# Patient Record
Sex: Female | Born: 1995 | Race: White | Hispanic: No | Marital: Single | Smoking: Former smoker
Health system: Southern US, Community
[De-identification: ages and names within clinical notes are randomized; demographics above are authoritative.]

## PROBLEM LIST (undated history)

## (undated) DIAGNOSIS — F419 Anxiety disorder, unspecified: Secondary | ICD-10-CM

## (undated) DIAGNOSIS — A549 Gonococcal infection, unspecified: Secondary | ICD-10-CM

## (undated) DIAGNOSIS — H9191 Unspecified hearing loss, right ear: Secondary | ICD-10-CM

## (undated) DIAGNOSIS — T7412XA Child physical abuse, confirmed, initial encounter: Secondary | ICD-10-CM

## (undated) DIAGNOSIS — A749 Chlamydial infection, unspecified: Secondary | ICD-10-CM

## (undated) HISTORY — DX: Unspecified hearing loss, right ear: H91.91

## (undated) HISTORY — DX: Anxiety disorder, unspecified: F41.9

## (undated) HISTORY — DX: Child physical abuse, confirmed, initial encounter: T74.12XA

## (undated) HISTORY — PX: TONSILLECTOMY: SUR1361

## (undated) HISTORY — PX: DILATION AND CURETTAGE OF UTERUS: SHX78

## (undated) HISTORY — DX: Chlamydial infection, unspecified: A74.9

## (undated) HISTORY — PX: LAPAROSCOPIC APPENDECTOMY: SUR753

## (undated) HISTORY — PX: APPENDECTOMY: SHX54

## (undated) HISTORY — DX: Gonococcal infection, unspecified: A54.9

## (undated) HISTORY — PX: INNER EAR SURGERY: SHX679

---
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
## 2014-01-12 ENCOUNTER — Ambulatory Visit (INDEPENDENT_AMBULATORY_CARE_PROVIDER_SITE_OTHER): Payer: BC Managed Care – PPO | Admitting: Obstetrics & Gynecology

## 2014-01-12 ENCOUNTER — Encounter: Payer: Self-pay | Admitting: Obstetrics & Gynecology

## 2014-01-12 VITALS — BP 135/82 | HR 120 | Ht 59.0 in | Wt 108.0 lb

## 2014-01-12 DIAGNOSIS — Z3401 Encounter for supervision of normal first pregnancy, first trimester: Secondary | ICD-10-CM

## 2014-01-12 DIAGNOSIS — Z23 Encounter for immunization: Secondary | ICD-10-CM | POA: Diagnosis not present

## 2014-01-12 DIAGNOSIS — Z3491 Encounter for supervision of normal pregnancy, unspecified, first trimester: Secondary | ICD-10-CM

## 2014-01-12 MED ORDER — INFLUENZA VAC SPLIT QUAD 0.5 ML IM SUSY
0.5000 mL | PREFILLED_SYRINGE | Freq: Once | INTRAMUSCULAR | Status: AC
  Administered 2014-01-12: 0.5 mL via INTRAMUSCULAR

## 2014-01-12 NOTE — Progress Notes (Signed)
Pt here for NOB intake.  This is a non-planned pregnancy.  Pt is G3 P0 A2  Bedside U/S shows IUP with FHT of 182 BPM and CRL 20.387mm

## 2014-01-13 ENCOUNTER — Telehealth: Payer: Self-pay | Admitting: *Deleted

## 2014-01-13 ENCOUNTER — Other Ambulatory Visit: Payer: Self-pay | Admitting: Obstetrics & Gynecology

## 2014-01-13 DIAGNOSIS — Z3400 Encounter for supervision of normal first pregnancy, unspecified trimester: Secondary | ICD-10-CM | POA: Insufficient documentation

## 2014-01-13 DIAGNOSIS — N760 Acute vaginitis: Secondary | ICD-10-CM

## 2014-01-13 DIAGNOSIS — B9689 Other specified bacterial agents as the cause of diseases classified elsewhere: Secondary | ICD-10-CM

## 2014-01-13 DIAGNOSIS — B373 Candidiasis of vulva and vagina: Secondary | ICD-10-CM

## 2014-01-13 DIAGNOSIS — Z3682 Encounter for antenatal screening for nuchal translucency: Secondary | ICD-10-CM

## 2014-01-13 DIAGNOSIS — B3731 Acute candidiasis of vulva and vagina: Secondary | ICD-10-CM

## 2014-01-13 LAB — OBSTETRIC PANEL
Antibody Screen: NEGATIVE
BASOS PCT: 0 % (ref 0–1)
Basophils Absolute: 0 10*3/uL (ref 0.0–0.1)
EOS ABS: 0 10*3/uL (ref 0.0–0.7)
Eosinophils Relative: 0 % (ref 0–5)
HCT: 38 % (ref 36.0–46.0)
HEP B S AG: NEGATIVE
Hemoglobin: 12.8 g/dL (ref 12.0–15.0)
Lymphocytes Relative: 23 % (ref 12–46)
Lymphs Abs: 2.9 10*3/uL (ref 0.7–4.0)
MCH: 31.1 pg (ref 26.0–34.0)
MCHC: 33.7 g/dL (ref 30.0–36.0)
MCV: 92.2 fL (ref 78.0–100.0)
MPV: 9.5 fL (ref 8.6–12.4)
Monocytes Absolute: 0.9 10*3/uL (ref 0.1–1.0)
Monocytes Relative: 7 % (ref 3–12)
NEUTROS ABS: 8.8 10*3/uL — AB (ref 1.7–7.7)
NEUTROS PCT: 70 % (ref 43–77)
PLATELETS: 285 10*3/uL (ref 150–400)
RBC: 4.12 MIL/uL (ref 3.87–5.11)
RDW: 13.5 % (ref 11.5–15.5)
RH TYPE: POSITIVE
Rubella: 5.79 Index — ABNORMAL HIGH (ref <0.90)
WBC: 12.5 10*3/uL — AB (ref 4.0–10.5)

## 2014-01-13 LAB — GC/CHLAMYDIA PROBE AMP

## 2014-01-13 LAB — WET PREP BY MOLECULAR PROBE
Candida species: POSITIVE — AB
GARDNERELLA VAGINALIS: POSITIVE — AB
Trichomonas vaginosis: NEGATIVE

## 2014-01-13 LAB — HIV ANTIBODY (ROUTINE TESTING W REFLEX): HIV: NONREACTIVE

## 2014-01-13 MED ORDER — METRONIDAZOLE 500 MG PO TABS: 500.0000 mg | ORAL_TABLET | Freq: Two times a day (BID) | ORAL | Status: DC

## 2014-01-13 MED ORDER — FLUCONAZOLE 150 MG PO TABS: 150.0000 mg | ORAL_TABLET | Freq: Once | ORAL | Status: DC

## 2014-01-13 NOTE — Telephone Encounter (Signed)
Called pt to adv needs to come in for urine specimen only. LMOM for pt to rtn call on Monday when office opens to schedule nurse visit for labs.

## 2014-01-13 NOTE — Telephone Encounter (Signed)
LM on voicemail of positive BV and yeast.  [facility] [place] Flagyl and Diflucan per Dr Penne LashLeggett.
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
## 2014-01-14 LAB — CULTURE, URINE COMPREHENSIVE
Colony Count: NO GROWTH
ORGANISM ID, BACTERIA: NO GROWTH

## 2014-01-15 NOTE — L&D Delivery Note (Addendum)
Delivery Note At 10:19 PM a viable female was delivered via Vaginal, Spontaneous Delivery (Presentation: ; Occiput Anterior).  APGAR: 6, 8; weight  pending.   Placenta status: Intact, Spontaneous.  Cord: 3 vessels with the following complications: None.    Anesthesia: Epidural  Episiotomy: None Lacerations: Labial Suture Repair: None Est. Blood Loss (mL): 125  Mom to postpartum.  Baby to Couplet care / Skin to Skin.  Lowanda Foster 08/26/2014, 10:38 PM  I was present for the entire delivery of baby and placenta and inspection of perineum and agree with above. Placenta to: BS Feeding: Breast Circ: IP Contraception: Depo  [place], CNM 08/26/2014 11:08 PM

## 2014-01-20 LAB — GC/CHLAMYDIA PROBE AMP
CT Probe RNA: NEGATIVE
GC PROBE AMP APTIMA: NEGATIVE

## 2014-02-08 ENCOUNTER — Ambulatory Visit (HOSPITAL_COMMUNITY)
Admission: RE | Admit: 2014-02-08 | Discharge: 2014-02-08 | Disposition: A | Discharge status: Home / Self Care | Payer: BLUE CROSS/BLUE SHIELD | Source: Ambulatory Visit | Attending: Obstetrics & Gynecology | Admitting: Obstetrics & Gynecology

## 2014-02-08 ENCOUNTER — Encounter (HOSPITAL_COMMUNITY): Payer: Self-pay

## 2014-02-08 DIAGNOSIS — Z36 Encounter for antenatal screening of mother: Secondary | ICD-10-CM | POA: Diagnosis not present

## 2014-02-08 DIAGNOSIS — O99321 Drug use complicating pregnancy, first trimester: Secondary | ICD-10-CM | POA: Insufficient documentation

## 2014-02-08 DIAGNOSIS — F1290 Cannabis use, unspecified, uncomplicated: Secondary | ICD-10-CM | POA: Insufficient documentation

## 2014-02-08 DIAGNOSIS — Z3A12 12 weeks gestation of pregnancy: Secondary | ICD-10-CM | POA: Diagnosis not present

## 2014-02-08 DIAGNOSIS — Z3682 Encounter for antenatal screening for nuchal translucency: Secondary | ICD-10-CM | POA: Insufficient documentation

## 2014-02-11 ENCOUNTER — Other Ambulatory Visit (HOSPITAL_COMMUNITY): Payer: Self-pay

## 2014-02-11 ENCOUNTER — Other Ambulatory Visit (HOSPITAL_COMMUNITY): Payer: BC Managed Care – PPO

## 2014-02-11 ENCOUNTER — Ambulatory Visit (HOSPITAL_COMMUNITY): Payer: BC Managed Care – PPO

## 2014-02-12 ENCOUNTER — Encounter: Payer: BC Managed Care – PPO | Admitting: Advanced Practice Midwife

## 2014-02-15 ENCOUNTER — Ambulatory Visit (INDEPENDENT_AMBULATORY_CARE_PROVIDER_SITE_OTHER): Payer: BLUE CROSS/BLUE SHIELD | Admitting: Obstetrics & Gynecology

## 2014-02-15 VITALS — BP 122/70 | HR 88 | Wt 110.0 lb

## 2014-02-15 DIAGNOSIS — Z3402 Encounter for supervision of normal first pregnancy, second trimester: Secondary | ICD-10-CM

## 2014-02-15 NOTE — Progress Notes (Signed)
Routine visit. No problems. We discussed weight gain recommendations. Her First screen was normal. U/S at 20 weeks.

## 2014-03-15 ENCOUNTER — Ambulatory Visit (INDEPENDENT_AMBULATORY_CARE_PROVIDER_SITE_OTHER): Payer: BLUE CROSS/BLUE SHIELD | Admitting: Advanced Practice Midwife

## 2014-03-15 VITALS — BP 118/65 | HR 79 | Wt 112.0 lb

## 2014-03-15 DIAGNOSIS — Z3402 Encounter for supervision of normal first pregnancy, second trimester: Secondary | ICD-10-CM

## 2014-03-15 DIAGNOSIS — Z3492 Encounter for supervision of normal pregnancy, unspecified, second trimester: Secondary | ICD-10-CM

## 2014-03-15 DIAGNOSIS — Z36 Encounter for antenatal screening of mother: Secondary | ICD-10-CM

## 2014-03-15 DIAGNOSIS — Z3482 Encounter for supervision of other normal pregnancy, second trimester: Secondary | ICD-10-CM

## 2014-03-15 NOTE — Progress Notes (Signed)
Rt sciatic pain.  Review of exercises

## 2014-03-15 NOTE — Progress Notes (Signed)
Anatomy scan scheduled. AFP today.

## 2014-03-15 NOTE — Patient Instructions (Signed)
Second Trimester of Pregnancy The second trimester is from week 13 through week 28, months 4 through 6. The second trimester is often a time when you feel your best. Your body has also adjusted to being pregnant, and you begin to feel better physically. Usually, morning sickness has lessened or quit completely, you may have more energy, and you may have an increase in appetite. The second trimester is also a time when the fetus is growing rapidly. At the end of the sixth month, the fetus is about 9 inches long and weighs about 1 pounds. You will likely begin to feel the baby move (quickening) between 18 and 20 weeks of the pregnancy. BODY CHANGES Your body goes through many changes during pregnancy. The changes vary from woman to woman.   Your weight will continue to increase. You will notice your lower abdomen bulging out.  You may begin to get stretch marks on your hips, abdomen, and breasts.  You may develop headaches that can be relieved by medicines approved by your health care provider.  You may urinate more often because the fetus is pressing on your bladder.  You may develop or continue to have heartburn as a result of your pregnancy.  You may develop constipation because certain hormones are causing the muscles that push waste through your intestines to slow down.  You may develop hemorrhoids or swollen, bulging veins (varicose veins).  You may have back pain because of the weight gain and pregnancy hormones relaxing your joints between the bones in your pelvis and as a result of a shift in weight and the muscles that support your balance.  Your breasts will continue to grow and be tender.  Your gums may bleed and may be sensitive to brushing and flossing.  Dark spots or blotches (chloasma, mask of pregnancy) may develop on your face. This will likely fade after the baby is born.  A dark line from your belly button to the pubic area (linea nigra) may appear. This will likely fade  after the baby is born.  You may have changes in your hair. These can include thickening of your hair, rapid growth, and changes in texture. Some women also have hair loss during or after pregnancy, or hair that feels dry or thin. Your hair will most likely return to normal after your baby is born. WHAT TO EXPECT AT YOUR PRENATAL VISITS During a routine prenatal visit:  You will be weighed to make sure you and the fetus are growing normally.  Your blood pressure will be taken.  Your abdomen will be measured to track your baby's growth.  The fetal heartbeat will be listened to.  Any test results from the previous visit will be discussed. Your health care provider may ask you:  How you are feeling.  If you are feeling the baby move.  If you have had any abnormal symptoms, such as leaking fluid, bleeding, severe headaches, or abdominal cramping.  If you have any questions. Other tests that may be performed during your second trimester include:  Blood tests that check for:  Low iron levels (anemia).  Gestational diabetes (between 24 and 28 weeks).  Rh antibodies.  Urine tests to check for infections, diabetes, or protein in the urine.  An ultrasound to confirm the proper growth and development of the baby.  An amniocentesis to check for possible genetic problems.  Fetal screens for spina bifida and Down syndrome. HOME CARE INSTRUCTIONS   Avoid all smoking, herbs, alcohol, and unprescribed   drugs. These chemicals affect the formation and growth of the baby.  Follow your health care provider's instructions regarding medicine use. There are medicines that are either safe or unsafe to take during pregnancy.  Exercise only as directed by your health care provider. Experiencing uterine cramps is a good sign to stop exercising.  Continue to eat regular, healthy meals.  Wear a good support bra for breast tenderness.  Do not use hot tubs, steam rooms, or saunas.  Wear your  seat belt at all times when driving.  Avoid raw meat, uncooked cheese, cat litter boxes, and soil used by cats. These carry germs that can cause birth defects in the baby.  Take your prenatal vitamins.  Try taking a stool softener (if your health care provider approves) if you develop constipation. Eat more high-fiber foods, such as fresh vegetables or fruit and whole grains. Drink plenty of fluids to keep your urine clear or pale yellow.  Take warm sitz baths to soothe any pain or discomfort caused by hemorrhoids. Use hemorrhoid cream if your health care provider approves.  If you develop varicose veins, wear support hose. Elevate your feet for 15 minutes, 3-4 times a day. Limit salt in your diet.  Avoid heavy lifting, wear low heel shoes, and practice good posture.  Rest with your legs elevated if you have leg cramps or low back pain.  Visit your dentist if you have not gone yet during your pregnancy. Use a soft toothbrush to brush your teeth and be gentle when you floss.  A sexual relationship may be continued unless your health care provider directs you otherwise.  Continue to go to all your prenatal visits as directed by your health care provider. SEEK MEDICAL CARE IF:   You have dizziness.  You have mild pelvic cramps, pelvic pressure, or nagging pain in the abdominal area.  You have persistent nausea, vomiting, or diarrhea.  You have a bad smelling vaginal discharge.  You have pain with urination. SEEK IMMEDIATE MEDICAL CARE IF:   You have a fever.  You are leaking fluid from your vagina.  You have spotting or bleeding from your vagina.  You have severe abdominal cramping or pain.  You have rapid weight gain or loss.  You have shortness of breath with chest pain.  You notice sudden or extreme swelling of your face, hands, ankles, feet, or legs.  You have not felt your baby move in over an hour.  You have severe headaches that do not go away with  medicine.  You have vision changes. Document Released: 12/26/2000 Document Revised: 01/06/2013 Document Reviewed: 03/04/2012 ExitCare Patient Information 2015 [employer]. This information is not intended to replace advice given to you by your health care provider. Make sure you discuss any questions you have with your health care provider.  

## 2014-03-16 LAB — ALPHA FETOPROTEIN, MATERNAL
AFP: 14 ng/mL
CURR GEST AGE: 17.4 wks.days
MOM FOR AFP: 0.29
Open Spina bifida: NEGATIVE

## 2014-03-26 ENCOUNTER — Telehealth: Payer: Self-pay | Admitting: *Deleted

## 2014-03-26 NOTE — Telephone Encounter (Signed)
Pt called states she noticed vaginal spotting after intercourse ('just 1 spot') but has not noticed anything since then. I explained to pt that spotting after intercourse is normal but will need to contact us if bleeding increases or continues. Pt expressed understanding.

## 2014-03-29 ENCOUNTER — Ambulatory Visit (HOSPITAL_COMMUNITY)
Admission: RE | Admit: 2014-03-29 | Discharge: 2014-03-29 | Disposition: A | Discharge status: Home / Self Care | Payer: BLUE CROSS/BLUE SHIELD | Source: Ambulatory Visit | Attending: Advanced Practice Midwife | Admitting: Advanced Practice Midwife

## 2014-03-29 DIAGNOSIS — Z36 Encounter for antenatal screening of mother: Secondary | ICD-10-CM | POA: Insufficient documentation

## 2014-03-29 DIAGNOSIS — Z3492 Encounter for supervision of normal pregnancy, unspecified, second trimester: Secondary | ICD-10-CM

## 2014-03-29 DIAGNOSIS — Z3A19 19 weeks gestation of pregnancy: Secondary | ICD-10-CM | POA: Diagnosis not present

## 2014-03-29 DIAGNOSIS — Z3689 Encounter for other specified antenatal screening: Secondary | ICD-10-CM | POA: Insufficient documentation

## 2014-04-12 ENCOUNTER — Ambulatory Visit (INDEPENDENT_AMBULATORY_CARE_PROVIDER_SITE_OTHER): Payer: BLUE CROSS/BLUE SHIELD | Admitting: Advanced Practice Midwife

## 2014-04-12 VITALS — BP 105/60 | HR 78 | Wt 121.0 lb

## 2014-04-12 DIAGNOSIS — Z3482 Encounter for supervision of other normal pregnancy, second trimester: Secondary | ICD-10-CM

## 2014-04-12 DIAGNOSIS — Z3492 Encounter for supervision of normal pregnancy, unspecified, second trimester: Secondary | ICD-10-CM

## 2014-04-12 NOTE — Progress Notes (Signed)
Has had a couple of episodes of light pink spotting

## 2014-04-12 NOTE — Progress Notes (Signed)
Doing well.  Good fetal movement, denies vaginal bleeding, LOF, cramping/contractions. Pt reports pink spotting, scant, x 2 episodes after intercourse.   Discussed normal changes to cervix/friability.  Recommend pt call office or go to [facility] if bleeding continues, or occurs without cause.  PTL precautions given/reasons to come to hospital.

## 2014-05-10 ENCOUNTER — Encounter: Payer: BLUE CROSS/BLUE SHIELD | Admitting: Advanced Practice Midwife

## 2014-05-11 ENCOUNTER — Ambulatory Visit (INDEPENDENT_AMBULATORY_CARE_PROVIDER_SITE_OTHER): Payer: BLUE CROSS/BLUE SHIELD | Admitting: Advanced Practice Midwife

## 2014-05-11 VITALS — BP 107/53 | HR 80 | Wt 126.0 lb

## 2014-05-11 DIAGNOSIS — Z3402 Encounter for supervision of normal first pregnancy, second trimester: Secondary | ICD-10-CM

## 2014-05-11 NOTE — Patient Instructions (Signed)

## 2014-05-11 NOTE — Progress Notes (Signed)
Active baby. 28 week labs. Enc CBE.  Discussed R/B circ.

## 2014-05-24 ENCOUNTER — Ambulatory Visit (INDEPENDENT_AMBULATORY_CARE_PROVIDER_SITE_OTHER): Payer: BLUE CROSS/BLUE SHIELD | Admitting: Advanced Practice Midwife

## 2014-05-24 VITALS — BP 119/81 | HR 101 | Wt 129.0 lb

## 2014-05-24 DIAGNOSIS — Z124 Encounter for screening for malignant neoplasm of cervix: Secondary | ICD-10-CM

## 2014-05-24 DIAGNOSIS — Z113 Encounter for screening for infections with a predominantly sexual mode of transmission: Secondary | ICD-10-CM

## 2014-05-24 DIAGNOSIS — B373 Candidiasis of vulva and vagina: Secondary | ICD-10-CM

## 2014-05-24 DIAGNOSIS — B3731 Acute candidiasis of vulva and vagina: Secondary | ICD-10-CM | POA: Insufficient documentation

## 2014-05-24 DIAGNOSIS — Z3402 Encounter for supervision of normal first pregnancy, second trimester: Secondary | ICD-10-CM

## 2014-05-24 DIAGNOSIS — Z3492 Encounter for supervision of normal pregnancy, unspecified, second trimester: Secondary | ICD-10-CM | POA: Diagnosis not present

## 2014-05-24 DIAGNOSIS — Z23 Encounter for immunization: Secondary | ICD-10-CM

## 2014-05-24 MED ORDER — FLUCONAZOLE 150 MG PO TABS: 150.0000 mg | ORAL_TABLET | Freq: Once | ORAL | Status: DC

## 2014-05-24 NOTE — Patient Instructions (Addendum)
Preterm Labor Information Preterm labor is when labor starts at less than 37 weeks of pregnancy. The normal length of a pregnancy is 39 to 41 weeks. CAUSES Often, there is no identifiable underlying cause as to why a woman goes into preterm labor. One of the most common known causes of preterm labor is infection. Infections of the uterus, cervix, vagina, amniotic sac, bladder, kidney, or even the lungs (pneumonia) can cause labor to start. Other suspected causes of preterm labor include:   Urogenital infections, such as yeast infections and bacterial vaginosis.   Uterine abnormalities (uterine shape, uterine septum, fibroids, or bleeding from the placenta).   A cervix that has been operated on (it may fail to stay closed).   Malformations in the fetus.   Multiple gestations (twins, triplets, and so on).   Breakage of the amniotic sac.  RISK FACTORS  Having a previous history of preterm labor.   Having premature rupture of membranes (PROM).   Having a placenta that covers the opening of the cervix (placenta previa).   Having a placenta that separates from the uterus (placental abruption).   Having a cervix that is too weak to hold the fetus in the uterus (incompetent cervix).   Having too much fluid in the amniotic sac (polyhydramnios).   Taking illegal drugs or smoking while pregnant.   Not gaining enough weight while pregnant.   Being younger than 4718 and older than 21 years old.   Having a low socioeconomic status.   Being African American. SYMPTOMS Signs and symptoms of preterm labor include:   Menstrual-like cramps, abdominal pain, or back pain.  Uterine contractions that are regular, as frequent as six in an hour, regardless of their intensity (may be mild or painful).  Contractions that start on the top of the uterus and spread down to the lower abdomen and back.   A sense of increased pelvic pressure.   A watery or bloody mucus discharge that  comes from the vagina.  TREATMENT Depending on the length of the pregnancy and other circumstances, your health care provider may suggest bed rest. If necessary, there are medicines that can be given to stop contractions and to mature the fetal lungs. If labor happens before 34 weeks of pregnancy, a prolonged hospital stay may be recommended. Treatment depends on the condition of both you and the fetus.  WHAT SHOULD YOU DO IF YOU THINK YOU ARE IN PRETERM LABOR? Call your health care provider right away. You will need to go to the hospital to get checked immediately. HOW CAN YOU PREVENT PRETERM LABOR IN FUTURE PREGNANCIES? You should:   Stop smoking if you smoke.  Maintain healthy weight gain and avoid chemicals and drugs that are not necessary.  Be watchful for any type of infection.  Inform your health care provider if you have a known history of preterm labor. Document Released: 03/24/2003 Document Revised: 09/03/2012 Document Reviewed: 02/04/2012 [facility] Patient Information 2015 [employer] [place]. This information is not intended to replace advice given to you by your health care provider. Make sure you discuss any questions you have with your health care provider.   Tdap Vaccine (Tetanus, Diphtheria, Pertussis): What You Need to Know 1. Why get vaccinated? Tetanus, diphtheria and pertussis can be very serious diseases, even for adolescents and adults. Tdap vaccine can protect us from these diseases. TETANUS (Lockjaw) causes painful muscle tightening and stiffness, usually all over the body.  It can lead to tightening of muscles in the head and neck so  you can't open your mouth, swallow, or sometimes even breathe. Tetanus kills about 1 out of 5 people who are infected. DIPHTHERIA can cause a thick coating to form in the back of the throat.  It can lead to breathing problems, paralysis, heart failure, and death. PERTUSSIS (Whooping Cough) causes severe coughing spells, which can cause  difficulty breathing, vomiting and disturbed sleep.  It can also lead to weight loss, incontinence, and rib fractures. Up to 2 in 100 adolescents and 5 in 100 adults with pertussis are hospitalized or have complications, which could include pneumonia or death. These diseases are caused by bacteria. Diphtheria and pertussis are spread from person to person through coughing or sneezing. Tetanus enters the body through cuts, scratches, or wounds. Before vaccines, the [place] saw as many as 200,000 cases a year of diphtheria and pertussis, and hundreds of cases of tetanus. Since vaccination began, tetanus and diphtheria have dropped by about 99% and pertussis by about 80%. 2. Tdap vaccine Tdap vaccine can protect adolescents and adults from tetanus, diphtheria, and pertussis. One dose of Tdap is routinely given at age 21 or 4412. People who did not get Tdap at that age should get it as soon as possible. Tdap is especially important for health care professionals and anyone having close contact with a baby younger than 12 months. Pregnant women should get a dose of Tdap during every pregnancy, to protect the newborn from pertussis. Infants are most at risk for severe, life-threatening complications from pertussis. A similar vaccine, called Td, protects from tetanus and diphtheria, but not pertussis. A Td booster should be given every 10 years. Tdap may be given as one of these boosters if you have not already gotten a dose. Tdap may also be given after a severe cut or burn to prevent tetanus infection. Your doctor can give you more information. Tdap may safely be given at the same time as other vaccines. 3. Some people should not get this vaccine  If you ever had a life-threatening allergic reaction after a dose of any tetanus, diphtheria, or pertussis containing vaccine, OR if you have a severe allergy to any part of this vaccine, you should not get Tdap. Tell your doctor if you have any severe  allergies.  If you had a coma, or long or multiple seizures within 7 days after a childhood dose of DTP or DTaP, you should not get Tdap, unless a cause other than the vaccine was found. You can still get Td.  Talk to your doctor if you:  have epilepsy or another nervous system problem,  had severe pain or swelling after any vaccine containing diphtheria, tetanus or pertussis,  ever had Guillain-Barr Syndrome (GBS),  aren't feeling well on the day the shot is scheduled. 4. Risks of a vaccine reaction With any medicine, including vaccines, there is a chance of side effects. These are usually mild and go away on their own, but serious reactions are also possible. Brief fainting spells can follow a vaccination, leading to injuries from falling. Sitting or lying down for about 15 minutes can help prevent these. Tell your doctor if you feel dizzy or light-headed, or have vision changes or ringing in the ears. Mild problems following Tdap (Did not interfere with activities)  Pain where the shot was given (about 3 in 4 adolescents or 2 in 3 adults)  Redness or swelling where the shot was given (about 1 person in 5)  Mild fever of at least 100.45F (up to about  in 25 adolescents or 1 in 100 adults)  Headache (about 3 or 4 people in 10)  Tiredness (about 1 person in 3 or 4)  Nausea, vomiting, diarrhea, stomach ache (up to 1 in 4 adolescents or 1 in 10 adults)  Chills, body aches, sore joints, rash, swollen glands (uncommon) Moderate problems following Tdap (Interfered with activities, but did not require medical attention)  Pain where the shot was given (about 1 in 5 adolescents or 1 in 100 adults)  Redness or swelling where the shot was given (up to about 1 in 16 adolescents or 1 in 25 adults)  Fever over 102F (about 1 in 100 adolescents or 1 in 250 adults)  Headache (about 3 in 20 adolescents or 1 in 10 adults)  Nausea, vomiting, diarrhea, stomach ache (up to 1 or 3 people in  100)  Swelling of the entire arm where the shot was given (up to about 3 in 100). Severe problems following Tdap (Unable to perform usual activities; required medical attention)  Swelling, severe pain, bleeding and redness in the arm where the shot was given (rare). A severe allergic reaction could occur after any vaccine (estimated less than 1 in a million doses). 5. What if there is a serious reaction? What should I look for?  Look for anything that concerns you, such as signs of a severe allergic reaction, very high fever, or behavior changes. Signs of a severe allergic reaction can include hives, swelling of the face and throat, difficulty breathing, a fast heartbeat, dizziness, and weakness. These would start a few minutes to a few hours after the vaccination. What should I do?  If you think it is a severe allergic reaction or other emergency that can't wait, call 9-1-1 or get the person to the nearest hospital. Otherwise, call your doctor.  Afterward, the reaction should be reported to the "Vaccine Adverse Event Reporting System" (VAERS). Your doctor might file this report, or you can do it yourself through the VAERS web site at www.vaers.hhs.gov, or by calling 1-800-822-7967. VAERS is only for reporting reactions. They do not give medical advice.  6. The National Vaccine Injury Compensation Program The National Vaccine Injury Compensation Program (VICP) is a federal program that was created to compensate people who may have been injured by certain vaccines. Persons who believe they may have been injured by a vaccine can learn about the program and about filing a claim by calling 1-800-338-2382 or visiting the VICP website at www.hrsa.gov/vaccinecompensation. 7. How can I learn more?  Ask your doctor.  Call your local or state health department.  Contact the Centers for Disease Control and Prevention (CDC):  Call 1-800-232-4636 or visit CDC's website at www.cdc.gov/vaccines. CDC  Tdap Vaccine VIS (05/24/11) Document Released: 07/03/2011 Document Revised: 05/18/2013 Document Reviewed: 04/15/2013 ExitCare Patient Information 2015 [employer]. This information is not intended to replace advice given to you by your health care provider. Make sure you discuss any questions you have with your health care provider.  

## 2014-05-24 NOTE — Progress Notes (Signed)
Didn't do 28 week labs last time. Doing them today. C/O increased [place] discharged and itching. Large amount of thick Duer discharge. Vaginal walls and cervic erythematous and friable C/W VC. Prefers Diflucan. Pap, TDaP today.

## 2014-05-25 LAB — CBC WITH DIFFERENTIAL/PLATELET
BASOS ABS: 0 10*3/uL (ref 0.0–0.1)
BASOS PCT: 0 % (ref 0–1)
EOS PCT: 0 % (ref 0–5)
Eosinophils Absolute: 0 10*3/uL (ref 0.0–0.7)
HCT: 34.8 % — ABNORMAL LOW (ref 36.0–46.0)
Hemoglobin: 11.7 g/dL — ABNORMAL LOW (ref 12.0–15.0)
LYMPHS PCT: 21 % (ref 12–46)
Lymphs Abs: 2.1 10*3/uL (ref 0.7–4.0)
MCH: 31.4 pg (ref 26.0–34.0)
MCHC: 33.6 g/dL (ref 30.0–36.0)
MCV: 93.3 fL (ref 78.0–100.0)
MONO ABS: 0.7 10*3/uL (ref 0.1–1.0)
MPV: 10.1 fL (ref 8.6–12.4)
Monocytes Relative: 7 % (ref 3–12)
Neutro Abs: 7.3 10*3/uL (ref 1.7–7.7)
Neutrophils Relative %: 72 % (ref 43–77)
PLATELETS: 181 10*3/uL (ref 150–400)
RBC: 3.73 MIL/uL — ABNORMAL LOW (ref 3.87–5.11)
RDW: 12.9 % (ref 11.5–15.5)
WBC: 10.2 10*3/uL (ref 4.0–10.5)

## 2014-05-25 LAB — RPR

## 2014-05-25 LAB — GLUCOSE TOLERANCE, 1 HOUR (50G) W/O FASTING: GLUCOSE 1 HOUR GTT: 117 mg/dL (ref 70–140)

## 2014-05-25 LAB — HIV ANTIBODY (ROUTINE TESTING W REFLEX): HIV: NONREACTIVE

## 2014-05-25 LAB — CYTOLOGY - PAP

## 2014-05-26 ENCOUNTER — Telehealth: Payer: Self-pay

## 2014-05-26 LAB — CERVICOVAGINAL ANCILLARY ONLY
BACTERIAL VAGINITIS: POSITIVE — AB
CANDIDA VAGINITIS: POSITIVE — AB

## 2014-05-26 NOTE — Telephone Encounter (Signed)
Left message for patient to return call back to give her lab results.  One hour gtt negative. Festus AloeJennifer Ledbetter Howard RN 05-26-14 845  AM

## 2014-06-01 ENCOUNTER — Other Ambulatory Visit: Payer: Self-pay | Admitting: Advanced Practice Midwife

## 2014-06-01 DIAGNOSIS — B3731 Acute candidiasis of vulva and vagina: Secondary | ICD-10-CM

## 2014-06-01 DIAGNOSIS — N760 Acute vaginitis: Principal | ICD-10-CM

## 2014-06-01 DIAGNOSIS — B9689 Other specified bacterial agents as the cause of diseases classified elsewhere: Secondary | ICD-10-CM

## 2014-06-01 DIAGNOSIS — B373 Candidiasis of vulva and vagina: Secondary | ICD-10-CM

## 2014-06-01 MED ORDER — METRONIDAZOLE 500 MG PO TABS: 500.0000 mg | ORAL_TABLET | Freq: Two times a day (BID) | ORAL | Status: DC

## 2014-06-01 NOTE — Progress Notes (Signed)
Pos BV, VVC. Already has Diflucan. Rx Flagyl.

## 2014-06-02 ENCOUNTER — Telehealth: Payer: Self-pay | Admitting: *Deleted

## 2014-06-02 NOTE — Telephone Encounter (Signed)
Pt notified of positive yeast and BV on affirm test.  She already has the RX for Diflucan but a RX for Flagyl called into her pharmacy for the BV per Dorathy KinsmanVirginia Smith, CNM

## 2014-06-07 ENCOUNTER — Ambulatory Visit (INDEPENDENT_AMBULATORY_CARE_PROVIDER_SITE_OTHER): Payer: BLUE CROSS/BLUE SHIELD | Admitting: Advanced Practice Midwife

## 2014-06-07 VITALS — BP 110/70 | HR 76 | Wt 131.0 lb

## 2014-06-07 DIAGNOSIS — Z3483 Encounter for supervision of other normal pregnancy, third trimester: Secondary | ICD-10-CM

## 2014-06-07 DIAGNOSIS — B9689 Other specified bacterial agents as the cause of diseases classified elsewhere: Secondary | ICD-10-CM

## 2014-06-07 DIAGNOSIS — A499 Bacterial infection, unspecified: Secondary | ICD-10-CM

## 2014-06-07 DIAGNOSIS — N760 Acute vaginitis: Secondary | ICD-10-CM

## 2014-06-09 ENCOUNTER — Telehealth: Payer: Self-pay

## 2014-06-09 MED ORDER — ONDANSETRON 4 MG PO TBDP: 4.0000 mg | ORAL_TABLET | Freq: Three times a day (TID) | ORAL | Status: DC | PRN

## 2014-06-09 NOTE — Patient Instructions (Signed)
Third Trimester of Pregnancy The third trimester is from week 29 through week 42, months 7 through 9. The third trimester is a time when the fetus is growing rapidly. At the end of the ninth month, the fetus is about 20 inches in length and weighs 6-10 pounds.  BODY CHANGES Your body goes through many changes during pregnancy. The changes vary from woman to woman.   Your weight will continue to increase. You can expect to gain 25-35 pounds (11-16 kg) by the end of the pregnancy.  You may begin to get stretch marks on your hips, abdomen, and breasts.  You may urinate more often because the fetus is moving lower into your pelvis and pressing on your bladder.  You may develop or continue to have heartburn as a result of your pregnancy.  You may develop constipation because certain hormones are causing the muscles that push waste through your intestines to slow down.  You may develop hemorrhoids or swollen, bulging veins (varicose veins).  You may have pelvic pain because of the weight gain and pregnancy hormones relaxing your joints between the bones in your pelvis. Backaches may result from overexertion of the muscles supporting your posture.  You may have changes in your hair. These can include thickening of your hair, rapid growth, and changes in texture. Some women also have hair loss during or after pregnancy, or hair that feels dry or thin. Your hair will most likely return to normal after your baby is born.  Your breasts will continue to grow and be tender. A yellow discharge may leak from your breasts called colostrum.  Your belly button may stick out.  You may feel short of breath because of your expanding uterus.  You may notice the fetus "dropping," or moving lower in your abdomen.  You may have a bloody mucus discharge. This usually occurs a few days to a week before labor begins.  Your cervix becomes thin and soft (effaced) near your due date. WHAT TO EXPECT AT YOUR PRENATAL  EXAMS  You will have prenatal exams every 2 weeks until week 36. Then, you will have weekly prenatal exams. During a routine prenatal visit:  You will be weighed to make sure you and the fetus are growing normally.  Your blood pressure is taken.  Your abdomen will be measured to track your baby's growth.  The fetal heartbeat will be listened to.  Any test results from the previous visit will be discussed.  You may have a cervical check near your due date to see if you have effaced. At around 36 weeks, your caregiver will check your cervix. At the same time, your caregiver will also perform a test on the secretions of the vaginal tissue. This test is to determine if a type of bacteria, Group B streptococcus, is present. Your caregiver will explain this further. Your caregiver may ask you:  What your birth plan is.  How you are feeling.  If you are feeling the baby move.  If you have had any abnormal symptoms, such as leaking fluid, bleeding, severe headaches, or abdominal cramping.  If you have any questions. Other tests or screenings that may be performed during your third trimester include:  Blood tests that check for low iron levels (anemia).  Fetal testing to check the health, activity level, and growth of the fetus. Testing is done if you have certain medical conditions or if there are problems during the pregnancy. FALSE LABOR You may feel small, irregular contractions that   eventually go away. These are called Braxton Hicks contractions, or false labor. Contractions may last for hours, days, or even weeks before true labor sets in. If contractions come at regular intervals, intensify, or become painful, it is best to be seen by your caregiver.  SIGNS OF LABOR   Menstrual-like cramps.  Contractions that are 5 minutes apart or less.  Contractions that start on the top of the uterus and spread down to the lower abdomen and back.  A sense of increased pelvic pressure or back  pain.  A watery or bloody mucus discharge that comes from the vagina. If you have any of these signs before the 37th week of pregnancy, call your caregiver right away. You need to go to the hospital to get checked immediately. HOME CARE INSTRUCTIONS   Avoid all smoking, herbs, alcohol, and unprescribed drugs. These chemicals affect the formation and growth of the baby.  Follow your caregiver's instructions regarding medicine use. There are medicines that are either safe or unsafe to take during pregnancy.  Exercise only as directed by your caregiver. Experiencing uterine cramps is a good sign to stop exercising.  Continue to eat regular, healthy meals.  Wear a good support bra for breast tenderness.  Do not use hot tubs, steam rooms, or saunas.  Wear your seat belt at all times when driving.  Avoid raw meat, uncooked cheese, cat litter boxes, and soil used by cats. These carry germs that can cause birth defects in the baby.  Take your prenatal vitamins.  Try taking a stool softener (if your caregiver approves) if you develop constipation. Eat more high-fiber foods, such as fresh vegetables or fruit and whole grains. Drink plenty of fluids to keep your urine clear or pale yellow.  Take warm sitz baths to soothe any pain or discomfort caused by hemorrhoids. Use hemorrhoid cream if your caregiver approves.  If you develop varicose veins, wear support hose. Elevate your feet for 15 minutes, 3-4 times a day. Limit salt in your diet.  Avoid heavy lifting, wear low heal shoes, and practice good posture.  Rest a lot with your legs elevated if you have leg cramps or low back pain.  Visit your dentist if you have not gone during your pregnancy. Use a soft toothbrush to brush your teeth and be gentle when you floss.  A sexual relationship may be continued unless your caregiver directs you otherwise.  Do not travel far distances unless it is absolutely necessary and only with the approval  of your caregiver.  Take prenatal classes to understand, practice, and ask questions about the labor and delivery.  Make a trial run to the hospital.  Pack your hospital bag.  Prepare the baby's nursery.  Continue to go to all your prenatal visits as directed by your caregiver. SEEK MEDICAL CARE IF:  You are unsure if you are in labor or if your water has broken.  You have dizziness.  You have mild pelvic cramps, pelvic pressure, or nagging pain in your abdominal area.  You have persistent nausea, vomiting, or diarrhea.  You have a bad smelling vaginal discharge.  You have pain with urination. SEEK IMMEDIATE MEDICAL CARE IF:   You have a fever.  You are leaking fluid from your vagina.  You have spotting or bleeding from your vagina.  You have severe abdominal cramping or pain.  You have rapid weight loss or gain.  You have shortness of breath with chest pain.  You notice sudden or extreme swelling   of your face, hands, ankles, feet, or legs.  You have not felt your baby move in over an hour.  You have severe headaches that do not go away with medicine.  You have vision changes. Document Released: 12/26/2000 Document Revised: 01/06/2013 Document Reviewed: 03/04/2012 [employer] Patient Information 2015 [employer]. This information is not intended to replace advice given to you by your health care provider. Make sure you discuss any questions you have with your health care provider.  

## 2014-06-09 NOTE — Telephone Encounter (Signed)
Patient states for the last two days she has been unable to eat anything. She states she doesn't vomit but does feel nauseous. Patient states she is able to keep fluids down. Patient made aware she should try and eat small frequent meals and avoid greasy fatty foods. Patient states understanding and requesting dissolving tablet medication. Patient pharmacy verified. Patient made aware to come back for evaluation if symptoms not improved. Patient states understanding.  Armandina StammerJennifer Gaye Scorza RN BSN 06-09-14 11:23 AM.

## 2014-06-09 NOTE — Progress Notes (Signed)
Has not taken Flagyl yet, was not sure it was safe. Reassured it is safe to take. Glucola normal

## 2014-06-21 ENCOUNTER — Ambulatory Visit (INDEPENDENT_AMBULATORY_CARE_PROVIDER_SITE_OTHER): Payer: BLUE CROSS/BLUE SHIELD | Admitting: Advanced Practice Midwife

## 2014-06-21 VITALS — BP 108/81 | HR 81 | Wt 133.0 lb

## 2014-06-21 DIAGNOSIS — N760 Acute vaginitis: Secondary | ICD-10-CM

## 2014-06-21 DIAGNOSIS — A499 Bacterial infection, unspecified: Secondary | ICD-10-CM

## 2014-06-21 DIAGNOSIS — B9689 Other specified bacterial agents as the cause of diseases classified elsewhere: Secondary | ICD-10-CM

## 2014-06-21 DIAGNOSIS — Z3402 Encounter for supervision of normal first pregnancy, second trimester: Secondary | ICD-10-CM

## 2014-06-21 MED ORDER — METRONIDAZOLE 0.75 % VA GEL: 1.0000 | Freq: Every day | VAGINAL | Status: DC

## 2014-06-21 NOTE — Progress Notes (Signed)
Can't take Flagyl pills. Discussed recomendation for PO Flagyl, but will try Metrogel.

## 2014-06-21 NOTE — Patient Instructions (Signed)
Braxton Hicks Contractions °Contractions of the uterus can occur throughout pregnancy. Contractions are not always a sign that you are in labor.  °WHAT ARE BRAXTON HICKS CONTRACTIONS?  °Contractions that occur before labor are called Braxton Hicks contractions, or false labor. Toward the end of pregnancy (32-34 weeks), these contractions can develop more often and may become more forceful. This is not true labor because these contractions do not result in opening (dilatation) and thinning of the cervix. They are sometimes difficult to tell apart from true labor because these contractions can be forceful and people have different pain tolerances. You should not feel embarrassed if you go to the hospital with false labor. Sometimes, the only way to tell if you are in true labor is for your health care provider to look for changes in the cervix. °If there are no prenatal problems or other health problems associated with the pregnancy, it is completely safe to be sent home with false labor and await the onset of true labor. °HOW CAN YOU TELL THE DIFFERENCE BETWEEN TRUE AND FALSE LABOR? °False Labor °· The contractions of false labor are usually shorter and not as hard as those of true labor.   °· The contractions are usually irregular.   °· The contractions are often felt in the front of the lower abdomen and in the groin.   °· The contractions may go away when you walk around or change positions while lying down.   °· The contractions get weaker and are shorter lasting as time goes on.   °· The contractions do not usually become progressively stronger, regular, and closer together as with true labor.   °True Labor °1. Contractions in true labor last 30-70 seconds, become very regular, usually become more intense, and increase in frequency.   °2. The contractions do not go away with walking.   °3. The discomfort is usually felt in the top of the uterus and spreads to the lower abdomen and low back.   °4. True labor can  be determined by your health care provider with an exam. This will show that the cervix is dilating and getting thinner.   °WHAT TO REMEMBER °· Keep up with your usual exercises and follow other instructions given by your health care provider.   °· Take medicines as directed by your health care provider.   °· Keep your regular prenatal appointments.   °· Eat and drink lightly if you think you are going into labor.   °· If Braxton Hicks contractions are making you uncomfortable:   °· Change your position from lying down or resting to walking, or from walking to resting.   °· Sit and rest in a tub of warm water.   °· Drink 2-3 glasses of water. Dehydration may cause these contractions.   °· Do slow and deep breathing several times an hour.   °WHEN SHOULD I SEEK IMMEDIATE MEDICAL CARE? °Seek immediate medical care if: °· Your contractions become stronger, more regular, and closer together.   °· You have fluid leaking or gushing from your vagina.   °· You have a fever.   °· You pass blood-tinged mucus.   °· You have vaginal bleeding.   °· You have continuous abdominal pain.   °· You have low back pain that you never had before.   °· You feel your baby's head pushing down and causing pelvic pressure.   °· Your baby is not moving as much as it used to.   °Document Released: 01/01/2005 Document Revised: 01/06/2013 Document Reviewed: 10/13/2012 °[employer]® Patient Information ©2015 [employer]. This information is not intended to replace advice given to you by your health care   provider. Make sure you discuss any questions you have with your health care provider. ° °Fetal Movement Counts °Patient Name: __________________________________________________ Patient Due Date: ____________________ °Performing a fetal movement count is highly recommended in high-risk pregnancies, but it is good for every pregnant woman to do. Your health care provider may ask you to start counting fetal movements at 28 weeks of the pregnancy. Fetal  movements often increase: °· After eating a full meal. °· After physical activity. °· After eating or drinking something sweet or cold. °· At rest. °Pay attention to when you feel the baby is most active. This will help you notice a pattern of your baby's sleep and wake cycles and what factors contribute to an increase in fetal movement. It is important to perform a fetal movement count at the same time each day when your baby is normally most active.  °HOW TO COUNT FETAL MOVEMENTS °5. Find a quiet and comfortable area to sit or lie down on your left side. Lying on your left side provides the best blood and oxygen circulation to your baby. °6. Write down the day and time on a sheet of paper or in a journal. °7. Start counting kicks, flutters, swishes, rolls, or jabs in a 2-hour period. You should feel at least 10 movements within 2 hours. °8. If you do not feel 10 movements in 2 hours, wait 2-3 hours and count again. Look for a change in the pattern or not enough counts in 2 hours. °SEEK MEDICAL CARE IF: °· You feel less than 10 counts in 2 hours, tried twice. °· There is no movement in over an hour. °· The pattern is changing or taking longer each day to reach 10 counts in 2 hours. °· You feel the baby is not moving as he or she usually does. °Date: ____________ Movements: ____________ Start time: ____________ Finish time: ____________  °Date: ____________ Movements: ____________ Start time: ____________ Finish time: ____________ °Date: ____________ Movements: ____________ Start time: ____________ Finish time: ____________ °Date: ____________ Movements: ____________ Start time: ____________ Finish time: ____________ °Date: ____________ Movements: ____________ Start time: ____________ Finish time: ____________ °Date: ____________ Movements: ____________ Start time: ____________ Finish time: ____________ °Date: ____________ Movements: ____________ Start time: ____________ Finish time: ____________ °Date: ____________  Movements: ____________ Start time: ____________ Finish time: ____________  °Date: ____________ Movements: ____________ Start time: ____________ Finish time: ____________ °Date: ____________ Movements: ____________ Start time: ____________ Finish time: ____________ °Date: ____________ Movements: ____________ Start time: ____________ Finish time: ____________ °Date: ____________ Movements: ____________ Start time: ____________ Finish time: ____________ °Date: ____________ Movements: ____________ Start time: ____________ Finish time: ____________ °Date: ____________ Movements: ____________ Start time: ____________ Finish time: ____________ °Date: ____________ Movements: ____________ Start time: ____________ Finish time: ____________  °Date: ____________ Movements: ____________ Start time: ____________ Finish time: ____________ °Date: ____________ Movements: ____________ Start time: ____________ Finish time: ____________ °Date: ____________ Movements: ____________ Start time: ____________ Finish time: ____________ °Date: ____________ Movements: ____________ Start time: ____________ Finish time: ____________ °Date: ____________ Movements: ____________ Start time: ____________ Finish time: ____________ °Date: ____________ Movements: ____________ Start time: ____________ Finish time: ____________ °Date: ____________ Movements: ____________ Start time: ____________ Finish time: ____________  °Date: ____________ Movements: ____________ Start time: ____________ Finish time: ____________ °Date: ____________ Movements: ____________ Start time: ____________ Finish time: ____________ °Date: ____________ Movements: ____________ Start time: ____________ Finish time: ____________ °Date: ____________ Movements: ____________ Start time: ____________ Finish time: ____________ °Date: ____________ Movements: ____________ Start time: ____________ Finish time: ____________ °Date: ____________ Movements: ____________ Start time:  ____________ Finish time: ____________ °Date: ____________ Movements:   ____________ Start time: ____________ Finish time: ____________  °Date: ____________ Movements: ____________ Start time: ____________ Finish time: ____________ °Date: ____________ Movements: ____________ Start time: ____________ Finish time: ____________ °Date: ____________ Movements: ____________ Start time: ____________ Finish time: ____________ °Date: ____________ Movements: ____________ Start time: ____________ Finish time: ____________ °Date: ____________ Movements: ____________ Start time: ____________ Finish time: ____________ °Date: ____________ Movements: ____________ Start time: ____________ Finish time: ____________ °Date: ____________ Movements: ____________ Start time: ____________ Finish time: ____________  °Date: ____________ Movements: ____________ Start time: ____________ Finish time: ____________ °Date: ____________ Movements: ____________ Start time: ____________ Finish time: ____________ °Date: ____________ Movements: ____________ Start time: ____________ Finish time: ____________ °Date: ____________ Movements: ____________ Start time: ____________ Finish time: ____________ °Date: ____________ Movements: ____________ Start time: ____________ Finish time: ____________ °Date: ____________ Movements: ____________ Start time: ____________ Finish time: ____________ °Date: ____________ Movements: ____________ Start time: ____________ Finish time: ____________  °Date: ____________ Movements: ____________ Start time: ____________ Finish time: ____________ °Date: ____________ Movements: ____________ Start time: ____________ Finish time: ____________ °Date: ____________ Movements: ____________ Start time: ____________ Finish time: ____________ °Date: ____________ Movements: ____________ Start time: ____________ Finish time: ____________ °Date: ____________ Movements: ____________ Start time: ____________ Finish time: ____________ °Date:  ____________ Movements: ____________ Start time: ____________ Finish time: ____________ °Date: ____________ Movements: ____________ Start time: ____________ Finish time: ____________  °Date: ____________ Movements: ____________ Start time: ____________ Finish time: ____________ °Date: ____________ Movements: ____________ Start time: ____________ Finish time: ____________ °Date: ____________ Movements: ____________ Start time: ____________ Finish time: ____________ °Date: ____________ Movements: ____________ Start time: ____________ Finish time: ____________ °Date: ____________ Movements: ____________ Start time: ____________ Finish time: ____________ °Date: ____________ Movements: ____________ Start time: ____________ Finish time: ____________ °Document Released: 01/31/2006 Document Revised: 05/18/2013 Document Reviewed: 10/29/2011 °[employer]® Patient Information ©2015 [employer]. This information is not intended to replace advice given to you by your health care provider. Make sure you discuss any questions you have with your health care provider. ° °

## 2014-07-05 ENCOUNTER — Ambulatory Visit (INDEPENDENT_AMBULATORY_CARE_PROVIDER_SITE_OTHER): Payer: BLUE CROSS/BLUE SHIELD | Admitting: Advanced Practice Midwife

## 2014-07-05 VITALS — BP 104/62 | HR 96 | Wt 133.0 lb

## 2014-07-05 DIAGNOSIS — Z3403 Encounter for supervision of normal first pregnancy, third trimester: Secondary | ICD-10-CM

## 2014-07-05 NOTE — Progress Notes (Signed)
Ketones-large 

## 2014-07-05 NOTE — Patient Instructions (Signed)
Braxton Hicks Contractions °Contractions of the uterus can occur throughout pregnancy. Contractions are not always a sign that you are in labor.  °WHAT ARE BRAXTON HICKS CONTRACTIONS?  °Contractions that occur before labor are called Braxton Hicks contractions, or false labor. Toward the end of pregnancy (32-34 weeks), these contractions can develop more often and may become more forceful. This is not true labor because these contractions do not result in opening (dilatation) and thinning of the cervix. They are sometimes difficult to tell apart from true labor because these contractions can be forceful and people have different pain tolerances. You should not feel embarrassed if you go to the hospital with false labor. Sometimes, the only way to tell if you are in true labor is for your health care provider to look for changes in the cervix. °If there are no prenatal problems or other health problems associated with the pregnancy, it is completely safe to be sent home with false labor and await the onset of true labor. °HOW CAN YOU TELL THE DIFFERENCE BETWEEN TRUE AND FALSE LABOR? °False Labor °· The contractions of false labor are usually shorter and not as hard as those of true labor.   °· The contractions are usually irregular.   °· The contractions are often felt in the front of the lower abdomen and in the groin.   °· The contractions may go away when you walk around or change positions while lying down.   °· The contractions get weaker and are shorter lasting as time goes on.   °· The contractions do not usually become progressively stronger, regular, and closer together as with true labor.   °True Labor °1. Contractions in true labor last 30-70 seconds, become very regular, usually become more intense, and increase in frequency.   °2. The contractions do not go away with walking.   °3. The discomfort is usually felt in the top of the uterus and spreads to the lower abdomen and low back.   °4. True labor can  be determined by your health care provider with an exam. This will show that the cervix is dilating and getting thinner.   °WHAT TO REMEMBER °· Keep up with your usual exercises and follow other instructions given by your health care provider.   °· Take medicines as directed by your health care provider.   °· Keep your regular prenatal appointments.   °· Eat and drink lightly if you think you are going into labor.   °· If Braxton Hicks contractions are making you uncomfortable:   °· Change your position from lying down or resting to walking, or from walking to resting.   °· Sit and rest in a tub of warm water.   °· Drink 2-3 glasses of water. Dehydration may cause these contractions.   °· Do slow and deep breathing several times an hour.   °WHEN SHOULD I SEEK IMMEDIATE MEDICAL CARE? °Seek immediate medical care if: °· Your contractions become stronger, more regular, and closer together.   °· You have fluid leaking or gushing from your vagina.   °· You have a fever.   °· You pass blood-tinged mucus.   °· You have vaginal bleeding.   °· You have continuous abdominal pain.   °· You have low back pain that you never had before.   °· You feel your baby's head pushing down and causing pelvic pressure.   °· Your baby is not moving as much as it used to.   °Document Released: 01/01/2005 Document Revised: 01/06/2013 Document Reviewed: 10/13/2012 °[employer]® Patient Information ©2015 [employer]. This information is not intended to replace advice given to you by your health care   provider. Make sure you discuss any questions you have with your health care provider. ° °Fetal Movement Counts °Patient Name: __________________________________________________ Patient Due Date: ____________________ °Performing a fetal movement count is highly recommended in high-risk pregnancies, but it is good for every pregnant woman to do. Your health care provider may ask you to start counting fetal movements at 28 weeks of the pregnancy. Fetal  movements often increase: °· After eating a full meal. °· After physical activity. °· After eating or drinking something sweet or cold. °· At rest. °Pay attention to when you feel the baby is most active. This will help you notice a pattern of your baby's sleep and wake cycles and what factors contribute to an increase in fetal movement. It is important to perform a fetal movement count at the same time each day when your baby is normally most active.  °HOW TO COUNT FETAL MOVEMENTS °5. Find a quiet and comfortable area to sit or lie down on your left side. Lying on your left side provides the best blood and oxygen circulation to your baby. °6. Write down the day and time on a sheet of paper or in a journal. °7. Start counting kicks, flutters, swishes, rolls, or jabs in a 2-hour period. You should feel at least 10 movements within 2 hours. °8. If you do not feel 10 movements in 2 hours, wait 2-3 hours and count again. Look for a change in the pattern or not enough counts in 2 hours. °SEEK MEDICAL CARE IF: °· You feel less than 10 counts in 2 hours, tried twice. °· There is no movement in over an hour. °· The pattern is changing or taking longer each day to reach 10 counts in 2 hours. °· You feel the baby is not moving as he or she usually does. °Date: ____________ Movements: ____________ Start time: ____________ Finish time: ____________  °Date: ____________ Movements: ____________ Start time: ____________ Finish time: ____________ °Date: ____________ Movements: ____________ Start time: ____________ Finish time: ____________ °Date: ____________ Movements: ____________ Start time: ____________ Finish time: ____________ °Date: ____________ Movements: ____________ Start time: ____________ Finish time: ____________ °Date: ____________ Movements: ____________ Start time: ____________ Finish time: ____________ °Date: ____________ Movements: ____________ Start time: ____________ Finish time: ____________ °Date: ____________  Movements: ____________ Start time: ____________ Finish time: ____________  °Date: ____________ Movements: ____________ Start time: ____________ Finish time: ____________ °Date: ____________ Movements: ____________ Start time: ____________ Finish time: ____________ °Date: ____________ Movements: ____________ Start time: ____________ Finish time: ____________ °Date: ____________ Movements: ____________ Start time: ____________ Finish time: ____________ °Date: ____________ Movements: ____________ Start time: ____________ Finish time: ____________ °Date: ____________ Movements: ____________ Start time: ____________ Finish time: ____________ °Date: ____________ Movements: ____________ Start time: ____________ Finish time: ____________  °Date: ____________ Movements: ____________ Start time: ____________ Finish time: ____________ °Date: ____________ Movements: ____________ Start time: ____________ Finish time: ____________ °Date: ____________ Movements: ____________ Start time: ____________ Finish time: ____________ °Date: ____________ Movements: ____________ Start time: ____________ Finish time: ____________ °Date: ____________ Movements: ____________ Start time: ____________ Finish time: ____________ °Date: ____________ Movements: ____________ Start time: ____________ Finish time: ____________ °Date: ____________ Movements: ____________ Start time: ____________ Finish time: ____________  °Date: ____________ Movements: ____________ Start time: ____________ Finish time: ____________ °Date: ____________ Movements: ____________ Start time: ____________ Finish time: ____________ °Date: ____________ Movements: ____________ Start time: ____________ Finish time: ____________ °Date: ____________ Movements: ____________ Start time: ____________ Finish time: ____________ °Date: ____________ Movements: ____________ Start time: ____________ Finish time: ____________ °Date: ____________ Movements: ____________ Start time:  ____________ Finish time: ____________ °Date: ____________ Movements:   ____________ Start time: ____________ Finish time: ____________  °Date: ____________ Movements: ____________ Start time: ____________ Finish time: ____________ °Date: ____________ Movements: ____________ Start time: ____________ Finish time: ____________ °Date: ____________ Movements: ____________ Start time: ____________ Finish time: ____________ °Date: ____________ Movements: ____________ Start time: ____________ Finish time: ____________ °Date: ____________ Movements: ____________ Start time: ____________ Finish time: ____________ °Date: ____________ Movements: ____________ Start time: ____________ Finish time: ____________ °Date: ____________ Movements: ____________ Start time: ____________ Finish time: ____________  °Date: ____________ Movements: ____________ Start time: ____________ Finish time: ____________ °Date: ____________ Movements: ____________ Start time: ____________ Finish time: ____________ °Date: ____________ Movements: ____________ Start time: ____________ Finish time: ____________ °Date: ____________ Movements: ____________ Start time: ____________ Finish time: ____________ °Date: ____________ Movements: ____________ Start time: ____________ Finish time: ____________ °Date: ____________ Movements: ____________ Start time: ____________ Finish time: ____________ °Date: ____________ Movements: ____________ Start time: ____________ Finish time: ____________  °Date: ____________ Movements: ____________ Start time: ____________ Finish time: ____________ °Date: ____________ Movements: ____________ Start time: ____________ Finish time: ____________ °Date: ____________ Movements: ____________ Start time: ____________ Finish time: ____________ °Date: ____________ Movements: ____________ Start time: ____________ Finish time: ____________ °Date: ____________ Movements: ____________ Start time: ____________ Finish time: ____________ °Date:  ____________ Movements: ____________ Start time: ____________ Finish time: ____________ °Date: ____________ Movements: ____________ Start time: ____________ Finish time: ____________  °Date: ____________ Movements: ____________ Start time: ____________ Finish time: ____________ °Date: ____________ Movements: ____________ Start time: ____________ Finish time: ____________ °Date: ____________ Movements: ____________ Start time: ____________ Finish time: ____________ °Date: ____________ Movements: ____________ Start time: ____________ Finish time: ____________ °Date: ____________ Movements: ____________ Start time: ____________ Finish time: ____________ °Date: ____________ Movements: ____________ Start time: ____________ Finish time: ____________ °Document Released: 01/31/2006 Document Revised: 05/18/2013 Document Reviewed: 10/29/2011 °[employer]® Patient Information ©2015 [employer]. This information is not intended to replace advice given to you by your health care provider. Make sure you discuss any questions you have with your health care provider. ° °

## 2014-07-05 NOTE — Progress Notes (Signed)
Few BH UC's. PTL precautions.

## 2014-07-20 ENCOUNTER — Ambulatory Visit (INDEPENDENT_AMBULATORY_CARE_PROVIDER_SITE_OTHER): Payer: BLUE CROSS/BLUE SHIELD | Admitting: Obstetrics & Gynecology

## 2014-07-20 VITALS — BP 117/74 | HR 82 | Wt 137.0 lb

## 2014-07-20 DIAGNOSIS — O210 Mild hyperemesis gravidarum: Secondary | ICD-10-CM

## 2014-07-20 DIAGNOSIS — Z3403 Encounter for supervision of normal first pregnancy, third trimester: Secondary | ICD-10-CM

## 2014-07-20 DIAGNOSIS — Z36 Encounter for antenatal screening of mother: Secondary | ICD-10-CM | POA: Diagnosis not present

## 2014-07-20 DIAGNOSIS — R110 Nausea: Secondary | ICD-10-CM

## 2014-07-20 DIAGNOSIS — O26899 Other specified pregnancy related conditions, unspecified trimester: Secondary | ICD-10-CM

## 2014-07-20 LAB — OB RESULTS CONSOLE GBS: STREP GROUP B AG: NEGATIVE

## 2014-07-20 LAB — OB RESULTS CONSOLE GC/CHLAMYDIA
CHLAMYDIA, DNA PROBE: NEGATIVE
GC PROBE AMP, GENITAL: NEGATIVE

## 2014-07-20 MED ORDER — FAMOTIDINE 20 MG PO TABS: 20.0000 mg | ORAL_TABLET | Freq: Two times a day (BID) | ORAL | Status: DC

## 2014-07-20 MED ORDER — ONDANSETRON 4 MG PO TBDP: 4.0000 mg | ORAL_TABLET | Freq: Three times a day (TID) | ORAL | Status: DC | PRN

## 2014-07-20 NOTE — Progress Notes (Signed)
Reports some nausea, wants Zofran.  This was refilled, also advised to take Pepcid for GERD.   Pelvic cultures done today.  No other complaints or concerns.  Labor and fetal movement precautions reviewed.

## 2014-07-20 NOTE — Patient Instructions (Signed)
Return to clinic for any obstetric concerns or go to [facility] for evaluation  

## 2014-07-21 LAB — GC/CHLAMYDIA PROBE AMP
CT Probe RNA: NEGATIVE
GC Probe RNA: NEGATIVE

## 2014-07-22 LAB — CULTURE, BETA STREP (GROUP B ONLY)

## 2014-07-26 ENCOUNTER — Ambulatory Visit (INDEPENDENT_AMBULATORY_CARE_PROVIDER_SITE_OTHER): Payer: BLUE CROSS/BLUE SHIELD | Admitting: Advanced Practice Midwife

## 2014-07-26 VITALS — BP 107/72 | HR 59 | Wt 135.0 lb

## 2014-07-26 DIAGNOSIS — Z3403 Encounter for supervision of normal first pregnancy, third trimester: Secondary | ICD-10-CM

## 2014-07-26 NOTE — Progress Notes (Signed)
Reviewed neg GBS, cultures. Concerned about losing weight. Slightly high weight gain height but fine overall.

## 2014-07-26 NOTE — Patient Instructions (Signed)
Braxton Hicks Contractions °Contractions of the uterus can occur throughout pregnancy. Contractions are not always a sign that you are in labor.  °WHAT ARE BRAXTON HICKS CONTRACTIONS?  °Contractions that occur before labor are called Braxton Hicks contractions, or false labor. Toward the end of pregnancy (32-34 weeks), these contractions can develop more often and may become more forceful. This is not true labor because these contractions do not result in opening (dilatation) and thinning of the cervix. They are sometimes difficult to tell apart from true labor because these contractions can be forceful and people have different pain tolerances. You should not feel embarrassed if you go to the hospital with false labor. Sometimes, the only way to tell if you are in true labor is for your health care provider to look for changes in the cervix. °If there are no prenatal problems or other health problems associated with the pregnancy, it is completely safe to be sent home with false labor and await the onset of true labor. °HOW CAN YOU TELL THE DIFFERENCE BETWEEN TRUE AND FALSE LABOR? °False Labor °· The contractions of false labor are usually shorter and not as hard as those of true labor.   °· The contractions are usually irregular.   °· The contractions are often felt in the front of the lower abdomen and in the groin.   °· The contractions may go away when you walk around or change positions while lying down.   °· The contractions get weaker and are shorter lasting as time goes on.   °· The contractions do not usually become progressively stronger, regular, and closer together as with true labor.   °True Labor °· Contractions in true labor last 30-70 seconds, become very regular, usually become more intense, and increase in frequency.   °· The contractions do not go away with walking.   °· The discomfort is usually felt in the top of the uterus and spreads to the lower abdomen and low back.   °· True labor can be  determined by your health care provider with an exam. This will show that the cervix is dilating and getting thinner.   °WHAT TO REMEMBER °· Keep up with your usual exercises and follow other instructions given by your health care provider.   °· Take medicines as directed by your health care provider.   °· Keep your regular prenatal appointments.   °· Eat and drink lightly if you think you are going into labor.   °· If Braxton Hicks contractions are making you uncomfortable:   °¨ Change your position from lying down or resting to walking, or from walking to resting.   °¨ Sit and rest in a tub of warm water.   °¨ Drink 2-3 glasses of water. Dehydration may cause these contractions.   °¨ Do slow and deep breathing several times an hour.   °WHEN SHOULD I SEEK IMMEDIATE MEDICAL CARE? °Seek immediate medical care if: °· Your contractions become stronger, more regular, and closer together.   °· You have fluid leaking or gushing from your vagina.   °· You have a fever.   °· You pass blood-tinged mucus.   °· You have vaginal bleeding.   °· You have continuous abdominal pain.   °· You have low back pain that you never had before.   °· You feel your baby's head pushing down and causing pelvic pressure.   °· Your baby is not moving as much as it used to.   °Document Released: 01/01/2005 Document Revised: 01/06/2013 Document Reviewed: 10/13/2012 °[employer]® Patient Information ©2015 [employer]. This information is not intended to replace advice given to you by your health care   provider. Make sure you discuss any questions you have with your health care provider. ° °

## 2014-08-02 ENCOUNTER — Encounter: Payer: Self-pay | Admitting: Advanced Practice Midwife

## 2014-08-02 ENCOUNTER — Ambulatory Visit (INDEPENDENT_AMBULATORY_CARE_PROVIDER_SITE_OTHER): Payer: BLUE CROSS/BLUE SHIELD | Admitting: Advanced Practice Midwife

## 2014-08-02 VITALS — BP 118/79 | HR 78 | Wt 136.0 lb

## 2014-08-02 DIAGNOSIS — Z3403 Encounter for supervision of normal first pregnancy, third trimester: Secondary | ICD-10-CM

## 2014-08-02 NOTE — Patient Instructions (Signed)

## 2014-08-02 NOTE — Progress Notes (Signed)
Subjective:  Donnetta HailJessica Lynne Marzette is a 19 y.o. G3P0020 at 6056w4d being seen today for ongoing prenatal care.  Patient reports occasional contractions.  Contractions: Not present.  Vag. Bleeding: None. Movement: Present. Denies leaking of fluid.   The following portions of the patient's history were reviewed and updated as appropriate: allergies, current medications, past family history, past medical history, past social history, past surgical history and problem list.   Objective:   Filed Vitals:   08/02/14 1108  BP: 118/79  Pulse: 78  Weight: 136 lb (61.689 kg)    Fetal Status: Fetal Heart Rate (bpm): 150   Movement: Present     General:  Alert, oriented and cooperative. Patient is in no acute distress.  Skin: Skin is warm and dry. No rash noted.   Cardiovascular: Normal heart rate noted  Respiratory: Normal respiratory effort, no problems with respiration noted  Abdomen: Soft, gravid, appropriate for gestational age. Pain/Pressure: Present     Vaginal: Vag. Bleeding: None.    Vag D/C Character: Thin  Cervix: Not evaluated        Extremities: Normal range of motion.  Edema: None  Mental Status: Normal mood and affect. Normal behavior. Normal judgment and thought content.   Urinalysis: Urine Protein: Negative Urine Glucose: Negative  GBS Neg  Assessment and Plan:  Pregnancy: G3P0020 at 1456w4d  There are no diagnoses linked to this encounter. Term labor symptoms and general obstetric precautions including but not limited to vaginal bleeding, contractions, leaking of fluid and fetal movement were reviewed in detail with the patient. [facility], IOL after 41 wks, etc Please refer to After Visit Summary for other counseling recommendations.   Return in one week for prenatal visit  Aviva SignsMarie L Justan Gaede, CNM

## 2014-08-09 ENCOUNTER — Ambulatory Visit (INDEPENDENT_AMBULATORY_CARE_PROVIDER_SITE_OTHER): Payer: BLUE CROSS/BLUE SHIELD | Admitting: Advanced Practice Midwife

## 2014-08-09 VITALS — BP 126/76 | HR 79 | Wt 138.0 lb

## 2014-08-09 DIAGNOSIS — Z3403 Encounter for supervision of normal first pregnancy, third trimester: Secondary | ICD-10-CM

## 2014-08-09 NOTE — Patient Instructions (Signed)

## 2014-08-11 NOTE — Progress Notes (Signed)
Subjective:  Isabella Landry is a 19 y.o. G3P0020 at [redacted]w[redacted]d being seen today for ongoing prenatal care.  Patient reports occasional contractions.   Vag. Bleeding: None. Movement: Present. Denies leaking of fluid.   The following portions of the patient's history were reviewed and updated as appropriate: allergies, current medications, past family history, past medical history, past social history, past surgical history and problem list.   Objective:   Filed Vitals:   08/09/14 1013  BP: 126/76  Pulse: 79  Weight: 138 lb (62.596 kg)    Fetal Status: Fetal Heart Rate (bpm): 142   Movement: Present     General:  Alert, oriented and cooperative. Patient is in no acute distress.  Skin: Skin is warm and dry. No rash noted.   Cardiovascular: Normal heart rate noted  Respiratory: Normal respiratory effort, no problems with respiration noted  Abdomen: Soft, gravid, appropriate for gestational age. Pain/Pressure: Present     Vaginal: Vag. Bleeding: None.    Vag D/C Character: Thin  Cervix: Exam revealed      1/long  Extremities: Normal range of motion.  Edema: None  Mental Status: Normal mood and affect. Normal behavior. Normal judgment and thought content.   Urinalysis: Urine Protein: Negative Urine Glucose: Negative  Assessment and Plan:  Pregnancy: G3P0020 at 106w6d  There are no diagnoses linked to this encounter. Term labor symptoms and general obstetric precautions including but not limited to vaginal bleeding, contractions, leaking of fluid and fetal movement were reviewed in detail with the patient. Please refer to After Visit Summary for other counseling recommendations.  Return in about 1 week (around 08/16/2014).   Dorathy Kinsman, CNM

## 2014-08-16 ENCOUNTER — Ambulatory Visit (INDEPENDENT_AMBULATORY_CARE_PROVIDER_SITE_OTHER): Payer: BLUE CROSS/BLUE SHIELD | Admitting: Advanced Practice Midwife

## 2014-08-16 VITALS — BP 104/57 | HR 72 | Wt 139.0 lb

## 2014-08-16 DIAGNOSIS — Z3483 Encounter for supervision of other normal pregnancy, third trimester: Secondary | ICD-10-CM

## 2014-08-16 DIAGNOSIS — Z3403 Encounter for supervision of normal first pregnancy, third trimester: Secondary | ICD-10-CM

## 2014-08-16 NOTE — Progress Notes (Signed)
Subjective:  Isabella Landry is a 19 y.o. G3P0020 at [redacted]w[redacted]d being seen today for ongoing prenatal care.  Patient reports irregular mild cramping.  Contractions: Irregular.  Vag. Bleeding: None. Movement: Present. Denies leaking of fluid.   The following portions of the patient's history were reviewed and updated as appropriate: allergies, current medications, past family history, past medical history, past social history, past surgical history and problem list.   Objective:   Filed Vitals:   08/16/14 0902  BP: 104/57  Pulse: 72  Weight: 139 lb (63.05 kg)    Fetal Status: Fetal Heart Rate (bpm): 130 Fundal Height: 38 cm Movement: Present  Presentation: Vertex  General:  Alert, oriented and cooperative. Patient is in no acute distress.  Skin: Skin is warm and dry. No rash noted.   Cardiovascular: Normal heart rate noted  Respiratory: Normal respiratory effort, no problems with respiration noted  Abdomen: Soft, gravid, appropriate for gestational age. Pain/Pressure: Present     Vaginal: Vag. Bleeding: None.    Vag D/C Character: Thin  Cervix: Exam revealed Dilation: 1 Effacement (%): 50 Station: -3  Extremities: Normal range of motion.  Edema: None  Mental Status: Normal mood and affect. Normal behavior. Normal judgment and thought content.   Urinalysis: Urine Protein: Negative Urine Glucose: Negative  Assessment and Plan:  Pregnancy: G3P0020 at [redacted]w[redacted]d  There are no diagnoses linked to this encounter. Term labor symptoms and general obstetric precautions including but not limited to vaginal bleeding, contractions, leaking of fluid and fetal movement were reviewed in detail with the patient. Please refer to After Visit Summary for other counseling recommendations.  Return in about 1 week (around 08/23/2014).   Hurshel Party, CNM

## 2014-08-16 NOTE — Patient Instructions (Signed)

## 2014-08-23 ENCOUNTER — Ambulatory Visit (INDEPENDENT_AMBULATORY_CARE_PROVIDER_SITE_OTHER): Payer: BLUE CROSS/BLUE SHIELD | Admitting: Obstetrics & Gynecology

## 2014-08-23 ENCOUNTER — Encounter: Payer: Self-pay | Admitting: Obstetrics & Gynecology

## 2014-08-23 VITALS — BP 113/75 | HR 92 | Wt 139.0 lb

## 2014-08-23 DIAGNOSIS — O480 Post-term pregnancy: Secondary | ICD-10-CM | POA: Diagnosis not present

## 2014-08-23 DIAGNOSIS — Z3483 Encounter for supervision of other normal pregnancy, third trimester: Secondary | ICD-10-CM

## 2014-08-23 NOTE — Progress Notes (Signed)
Subjective:  Isabella Landry is a 19 y.o. G3P0020 at [redacted]w[redacted]d being seen today for ongoing prenatal care.  Patient reports no complaints.  Contractions: Irregular.  Vag. Bleeding: None. Movement: Present. Denies leaking of fluid.   The following portions of the patient's history were reviewed and updated as appropriate: allergies, current medications, past family history, past medical history, past social history, past surgical history and problem list.   Objective:   Filed Vitals:   08/23/14 1344  BP: 113/75  Pulse: 92  Weight: 139 lb (63.05 kg)    Fetal Status: Fetal Heart Rate (bpm): NST   Movement: Present     General:  Alert, oriented and cooperative. Patient is in no acute distress.  Skin: Skin is warm and dry. No rash noted.   Cardiovascular: Normal heart rate noted  Respiratory: Normal respiratory effort, no problems with respiration noted  Abdomen: Soft, gravid, appropriate for gestational age. Pain/Pressure: Present     Pelvic: Vag. Bleeding: None Vag D/C Character: Thin   Cervical exam performed Dilation: 1.5 Effacement (%): 80 Station: -2  Extremities: Normal range of motion.  Edema: None  Mental Status: Normal mood and affect. Normal behavior. Normal judgment and thought content.   Urinalysis:      Assessment and Plan:  Pregnancy: G3P0020 at [redacted]w[redacted]d  1. Post-term pregnancy, 40-42 weeks of gestation IOL for 41 weeks - Fetal non-stress test; Standing  Term labor symptoms and general obstetric precautions including but not limited to vaginal bleeding, contractions, leaking of fluid and fetal movement were reviewed in detail with the patient.  Please refer to After Visit Summary for other counseling recommendations.  Return in about 6 weeks (around 10/04/2014) for postpartum visit.   Allie Bossier, MD

## 2014-08-24 ENCOUNTER — Telehealth (HOSPITAL_COMMUNITY): Payer: Self-pay | Admitting: *Deleted

## 2014-08-24 ENCOUNTER — Encounter (HOSPITAL_COMMUNITY): Payer: Self-pay | Admitting: *Deleted

## 2014-08-24 NOTE — Telephone Encounter (Signed)
Preadmission screen  

## 2014-08-26 ENCOUNTER — Inpatient Hospital Stay (HOSPITAL_COMMUNITY): Payer: Medicaid Other | Admitting: Anesthesiology

## 2014-08-26 ENCOUNTER — Inpatient Hospital Stay (HOSPITAL_COMMUNITY)
Admission: RE | Admit: 2014-08-26 | Discharge: 2014-08-28 | DRG: 775 | Disposition: A | Discharge status: Home / Self Care | Payer: Medicaid Other | Source: Ambulatory Visit | Attending: Obstetrics & Gynecology | Admitting: Obstetrics & Gynecology

## 2014-08-26 ENCOUNTER — Encounter (HOSPITAL_COMMUNITY): Payer: Self-pay

## 2014-08-26 DIAGNOSIS — H9191 Unspecified hearing loss, right ear: Secondary | ICD-10-CM | POA: Diagnosis present

## 2014-08-26 DIAGNOSIS — Z87891 Personal history of nicotine dependence: Secondary | ICD-10-CM

## 2014-08-26 DIAGNOSIS — Z3A41 41 weeks gestation of pregnancy: Secondary | ICD-10-CM | POA: Diagnosis present

## 2014-08-26 DIAGNOSIS — O480 Post-term pregnancy: Secondary | ICD-10-CM | POA: Diagnosis present

## 2014-08-26 DIAGNOSIS — K219 Gastro-esophageal reflux disease without esophagitis: Secondary | ICD-10-CM | POA: Diagnosis present

## 2014-08-26 DIAGNOSIS — O9962 Diseases of the digestive system complicating childbirth: Secondary | ICD-10-CM | POA: Diagnosis present

## 2014-08-26 LAB — CBC
HCT: 35 % — ABNORMAL LOW (ref 36.0–46.0)
HEMOGLOBIN: 11.7 g/dL — AB (ref 12.0–15.0)
MCH: 30.5 pg (ref 26.0–34.0)
MCHC: 33.4 g/dL (ref 30.0–36.0)
MCV: 91.4 fL (ref 78.0–100.0)
PLATELETS: 207 10*3/uL (ref 150–400)
RBC: 3.83 MIL/uL — AB (ref 3.87–5.11)
RDW: 13.7 % (ref 11.5–15.5)
WBC: 9.7 10*3/uL (ref 4.0–10.5)

## 2014-08-26 LAB — TYPE AND SCREEN
ABO/RH(D): A POS
ANTIBODY SCREEN: NEGATIVE

## 2014-08-26 LAB — ABO/RH: ABO/RH(D): A POS

## 2014-08-26 MED ORDER — LIDOCAINE HCL (PF) 1 % IJ SOLN
30.0000 mL | INTRAMUSCULAR | Status: DC | PRN
  Filled 2014-08-26: qty 30

## 2014-08-26 MED ORDER — OXYCODONE-ACETAMINOPHEN 5-325 MG PO TABS
1.0000 | ORAL_TABLET | ORAL | Status: DC | PRN
Start: 2014-08-26 — End: 2014-08-27

## 2014-08-26 MED ORDER — CITRIC ACID-SODIUM CITRATE 334-500 MG/5ML PO SOLN
30.0000 mL | ORAL | Status: DC | PRN
  Filled 2014-08-26: qty 15

## 2014-08-26 MED ORDER — ONDANSETRON HCL 4 MG/2ML IJ SOLN: 4.0000 mg | Freq: Four times a day (QID) | INTRAMUSCULAR | Status: DC | PRN

## 2014-08-26 MED ORDER — DIPHENHYDRAMINE HCL 50 MG/ML IJ SOLN: 12.5000 mg | INTRAMUSCULAR | Status: DC | PRN

## 2014-08-26 MED ORDER — ACETAMINOPHEN 325 MG PO TABS: 650.0000 mg | ORAL_TABLET | ORAL | Status: DC | PRN

## 2014-08-26 MED ORDER — IBUPROFEN 100 MG/5ML PO SUSP
600.0000 mg | Freq: Four times a day (QID) | ORAL | Status: DC
  Administered 2014-08-27 – 2014-08-28 (×6): 600 mg via ORAL
  Filled 2014-08-26 (×11): qty 30

## 2014-08-26 MED ORDER — FENTANYL 2.5 MCG/ML BUPIVACAINE 1/10 % EPIDURAL INFUSION (WH - ANES)
14.0000 mL/h | INTRAMUSCULAR | Status: DC | PRN
  Administered 2014-08-26: 11 mL/h via EPIDURAL
  Administered 2014-08-26: 14 mL/h via EPIDURAL
  Filled 2014-08-26: qty 125

## 2014-08-26 MED ORDER — PHENYLEPHRINE 40 MCG/ML (10ML) SYRINGE FOR IV PUSH (FOR BLOOD PRESSURE SUPPORT): 80.0000 ug | PREFILLED_SYRINGE | INTRAVENOUS | Status: DC | PRN

## 2014-08-26 MED ORDER — OXYTOCIN BOLUS FROM INFUSION
500.0000 mL | INTRAVENOUS | Status: DC
  Administered 2014-08-26: 500 mL via INTRAVENOUS

## 2014-08-26 MED ORDER — LIDOCAINE HCL (PF) 1 % IJ SOLN
INTRAMUSCULAR | Status: DC | PRN
  Administered 2014-08-26: 3 mL via EPIDURAL
  Administered 2014-08-26: 4 mL via EPIDURAL

## 2014-08-26 MED ORDER — OXYTOCIN 40 UNITS IN LACTATED RINGERS INFUSION - SIMPLE MED
62.5000 mL/h | INTRAVENOUS | Status: DC
  Filled 2014-08-26: qty 1000

## 2014-08-26 MED ORDER — TERBUTALINE SULFATE 1 MG/ML IJ SOLN
0.2500 mg | Freq: Once | INTRAMUSCULAR | Status: AC
  Administered 2014-08-26: 0.25 mg via SUBCUTANEOUS

## 2014-08-26 MED ORDER — LACTATED RINGERS IV SOLN
500.0000 mL | INTRAVENOUS | Status: DC | PRN
  Administered 2014-08-26: 1000 mL via INTRAVENOUS

## 2014-08-26 MED ORDER — LACTATED RINGERS IV SOLN
INTRAVENOUS | Status: DC
  Administered 2014-08-26: 18:00:00 via INTRAUTERINE

## 2014-08-26 MED ORDER — TERBUTALINE SULFATE 1 MG/ML IJ SOLN
0.2500 mg | Freq: Once | INTRAMUSCULAR | Status: AC | PRN
  Administered 2014-08-26: 0.25 mg via SUBCUTANEOUS
  Filled 2014-08-26: qty 1

## 2014-08-26 MED ORDER — OXYCODONE-ACETAMINOPHEN 5-325 MG PO TABS
2.0000 | ORAL_TABLET | ORAL | Status: DC | PRN
Start: 2014-08-26 — End: 2014-08-27

## 2014-08-26 MED ORDER — LACTATED RINGERS IV SOLN
INTRAVENOUS | Status: DC
  Administered 2014-08-26: 16:00:00 via INTRAVENOUS

## 2014-08-26 MED ORDER — PHENYLEPHRINE 40 MCG/ML (10ML) SYRINGE FOR IV PUSH (FOR BLOOD PRESSURE SUPPORT)
80.0000 ug | PREFILLED_SYRINGE | INTRAVENOUS | Status: DC | PRN
  Administered 2014-08-26: 40 ug via INTRAVENOUS
  Filled 2014-08-26: qty 2
  Filled 2014-08-26: qty 20

## 2014-08-26 MED ORDER — TERBUTALINE SULFATE 1 MG/ML IJ SOLN
INTRAMUSCULAR | Status: AC
  Administered 2014-08-26: 0.25 mg via SUBCUTANEOUS
  Filled 2014-08-26: qty 1

## 2014-08-26 MED ORDER — FLEET ENEMA 7-19 GM/118ML RE ENEM: 1.0000 | ENEMA | RECTAL | Status: DC | PRN

## 2014-08-26 MED ORDER — ZOLPIDEM TARTRATE 5 MG PO TABS: 5.0000 mg | ORAL_TABLET | Freq: Every evening | ORAL | Status: DC | PRN

## 2014-08-26 MED ORDER — EPHEDRINE 5 MG/ML INJ
10.0000 mg | INTRAVENOUS | Status: DC | PRN
  Filled 2014-08-26: qty 2

## 2014-08-26 MED ORDER — MISOPROSTOL 25 MCG QUARTER TABLET
25.0000 ug | ORAL_TABLET | ORAL | Status: DC
  Administered 2014-08-26 (×2): 25 ug via VAGINAL
  Filled 2014-08-26: qty 0.25
  Filled 2014-08-26: qty 1
  Filled 2014-08-26: qty 0.25
  Filled 2014-08-26 (×2): qty 1

## 2014-08-26 MED ORDER — FENTANYL 2.5 MCG/ML BUPIVACAINE 1/10 % EPIDURAL INFUSION (WH - ANES): 11.0000 mL/h | INTRAMUSCULAR | Status: DC | PRN

## 2014-08-26 MED ORDER — FENTANYL CITRATE (PF) 100 MCG/2ML IJ SOLN
100.0000 ug | INTRAMUSCULAR | Status: DC | PRN
  Administered 2014-08-26: 100 ug via INTRAVENOUS
  Filled 2014-08-26: qty 2
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
## 2014-08-26 NOTE — Progress Notes (Signed)
Isabella Landry is a 19 y.o. G3P0020 at [redacted]w[redacted]d admitted for induction of labor due to Post dates.  Subjective: Felt crampy with initial cytotec placement this morning, but no complaints now.  Objective: BP 117/79 mmHg  Pulse 77  Temp(Src) 98.6 F (37 C) (Oral)  Resp 20  Ht  (1.499 m)  Wt 63.05 kg (139 lb)  BMI 28.06 kg/m2  LMP 11/12/2013      FHT:  FHR: 125-135 bpm, variability: moderate,  accelerations:  Present,  decelerations:  Absent UC:   irregular, every 1-4 minutes SVE:   Dilation: 1 Effacement (%): 70 Station: -2, -3 Exam by:: K.Gery Sabedra, CNM- cx very anterior  Labs: Lab Results  Component Value Date   WBC 9.7 08/26/2014   HGB 11.7* 08/26/2014   HCT 35.0* 08/26/2014   MCV 91.4 08/26/2014   PLT 207 08/26/2014    Assessment / Plan: IOL process Post term Cx unfavorable  Second dose of vag cytotec inserted  Plan on a dose or two more of cytotec and then foley  Cayle Cordoba CNM 08/26/2014, 1:58 PM

## 2014-08-26 NOTE — Anesthesia Preprocedure Evaluation (Signed)
Anesthesia Evaluation  Patient identified by MRN, date of birth, ID band Patient awake    Reviewed: Allergy & Precautions, Patient's Chart, lab work & pertinent test results  Airway Mallampati: II  TM Distance: >3 FB Neck ROM: Full    Dental no notable dental hx. (+) Teeth Intact   Pulmonary former smoker,  breath sounds clear to auscultation  Pulmonary exam normal       Cardiovascular negative cardio ROS Normal cardiovascular examRhythm:Regular Rate:Normal     Neuro/Psych Anxiety Deafness Right ear negative neurological ROS     GI/Hepatic negative GI ROS, Neg liver ROS, GERD-  ,  Endo/Other  negative endocrine ROS  Renal/GU negative Renal ROS  negative genitourinary   Musculoskeletal negative musculoskeletal ROS (+)   Abdominal   Peds  Hematology  (+) anemia ,   Anesthesia Other Findings   Reproductive/Obstetrics (+) Pregnancy                             Anesthesia Physical Anesthesia Plan  ASA: II  Anesthesia Plan: Epidural   Post-op Pain Management:    Induction:   Airway Management Planned: Natural Airway  Additional Equipment:   Intra-op Plan:   Post-operative Plan:   Informed Consent: I have reviewed the patients History and Physical, chart, labs and discussed the procedure including the risks, benefits and alternatives for the proposed anesthesia with the patient or authorized representative who has indicated his/her understanding and acceptance.     Plan Discussed with: Anesthesiologist  Anesthesia Plan Comments:         Anesthesia Quick Evaluation

## 2014-08-26 NOTE — Progress Notes (Signed)
Patient ID: Isabella Landry, female   DOB: 1995-03-15, 19 y.o.   MRN: [id number]  CTSP @ 1738 w/ FHR decel to nadir of 70s x a total of 6-7 minutes.  This was brought on shortly after SROM and pt noted to be 6cm. The usual measures like position changes, O2, IVF bolus, IFSE placed were employed in addition to a dose of Terb due to ctx q 1-1.71mins (spont, after cytotec x 2 during the day earlier). Pt also had an epidural placed approx 40 mins earlier, so phenylephrine given to account for possible reaction to low mat BP (105/70). Dr Erin Fulling called to evaluate pt. At 1804 pt noted tod be 8cm when an IUPC was placed and an amnioinfusion started. Then at 1820 Dr H-S examined pt again and found her to be 9cm, at which point a second dose of Terb was given as ctx were again q 1-1.1mins with FHR responding w/ decels. By 4098 the FHR had stablized in the 150s.

## 2014-08-26 NOTE — Anesthesia Procedure Notes (Signed)
Epidural Patient location during procedure: OB Start time: 08/26/2014 4:52 PM  Staffing Anesthesiologist: Mal Amabile Performed by: anesthesiologist   Preanesthetic Checklist Completed: patient identified, site marked, surgical consent, pre-op evaluation, timeout performed, IV checked, risks and benefits discussed and monitors and equipment checked  Epidural Patient position: sitting Prep: site prepped and draped and DuraPrep Patient monitoring: continuous pulse ox and blood pressure Approach: midline Location: L3-L4 Injection technique: LOR air  Needle:  Needle type: Tuohy  Needle gauge: 17 G Needle length: 9 cm and 9 Needle insertion depth: 4 cm Catheter type: closed end flexible Catheter size: 19 Gauge Catheter at skin depth: 9 cm Test dose: negative and Other  Assessment Events: blood not aspirated, injection not painful, no injection resistance, negative IV test and no paresthesia  Additional Notes Patient identified. Risks and benefits discussed including failed block, incomplete  Pain control, post dural puncture headache, nerve damage, paralysis, blood pressure Changes, nausea, vomiting, reactions to medications-both toxic and allergic and post Partum back pain. All questions were answered. Patient expressed understanding and wished to proceed. Sterile technique was used throughout procedure. Epidural site was Dressed with sterile barrier dressing. No paresthesias, signs of intravascular injection Or signs of intrathecal spread were encountered.  Patient was more comfortable after the epidural was dosed. Please see RN's note for documentation of vital signs and FHR which are stable.

## 2014-08-27 ENCOUNTER — Encounter (HOSPITAL_COMMUNITY): Payer: Self-pay

## 2014-08-27 LAB — RPR: RPR: NONREACTIVE

## 2014-08-27 MED ORDER — LANOLIN HYDROUS EX OINT: TOPICAL_OINTMENT | CUTANEOUS | Status: DC | PRN

## 2014-08-27 MED ORDER — OXYCODONE-ACETAMINOPHEN 5-325 MG PO TABS: 1.0000 | ORAL_TABLET | ORAL | Status: DC | PRN

## 2014-08-27 MED ORDER — OXYCODONE HCL 5 MG/5ML PO SOLN
5.0000 mg | ORAL | Status: DC | PRN
  Administered 2014-08-27: 10 mg via ORAL
  Filled 2014-08-27 (×3): qty 10

## 2014-08-27 MED ORDER — BENZOCAINE-MENTHOL 20-0.5 % EX AERO
1.0000 "application " | INHALATION_SPRAY | CUTANEOUS | Status: DC | PRN
  Administered 2014-08-27: 1 via TOPICAL
  Filled 2014-08-27: qty 56

## 2014-08-27 MED ORDER — ACETAMINOPHEN 160 MG/5ML PO SOLN: 325.0000 mg | ORAL | Status: DC | PRN

## 2014-08-27 MED ORDER — DIPHENHYDRAMINE HCL 25 MG PO CAPS: 25.0000 mg | ORAL_CAPSULE | Freq: Four times a day (QID) | ORAL | Status: DC | PRN

## 2014-08-27 MED ORDER — ONDANSETRON HCL 4 MG PO TABS: 4.0000 mg | ORAL_TABLET | ORAL | Status: DC | PRN

## 2014-08-27 MED ORDER — PRENATAL MULTIVITAMIN CH
1.0000 | ORAL_TABLET | Freq: Every day | ORAL | Status: DC
  Administered 2014-08-28: 1 via ORAL
  Filled 2014-08-27 (×2): qty 1

## 2014-08-27 MED ORDER — SENNOSIDES-DOCUSATE SODIUM 8.6-50 MG PO TABS
2.0000 | ORAL_TABLET | ORAL | Status: DC
  Administered 2014-08-28: 2 via ORAL
  Filled 2014-08-27: qty 2

## 2014-08-27 MED ORDER — TETANUS-DIPHTH-ACELL PERTUSSIS 5-2.5-18.5 LF-MCG/0.5 IM SUSP: 0.5000 mL | Freq: Once | INTRAMUSCULAR | Status: DC

## 2014-08-27 MED ORDER — WITCH HAZEL-GLYCERIN EX PADS: 1.0000 "application " | MEDICATED_PAD | CUTANEOUS | Status: DC | PRN

## 2014-08-27 MED ORDER — OXYCODONE-ACETAMINOPHEN 5-325 MG PO TABS: 2.0000 | ORAL_TABLET | ORAL | Status: DC | PRN

## 2014-08-27 MED ORDER — ZOLPIDEM TARTRATE 5 MG PO TABS: 5.0000 mg | ORAL_TABLET | Freq: Every evening | ORAL | Status: DC | PRN

## 2014-08-27 MED ORDER — ONDANSETRON HCL 4 MG/2ML IJ SOLN: 4.0000 mg | INTRAMUSCULAR | Status: DC | PRN

## 2014-08-27 MED ORDER — DIBUCAINE 1 % RE OINT: 1.0000 "application " | TOPICAL_OINTMENT | RECTAL | Status: DC | PRN

## 2014-08-27 MED ORDER — ACETAMINOPHEN 325 MG PO TABS: 650.0000 mg | ORAL_TABLET | ORAL | Status: DC | PRN

## 2014-08-27 MED ORDER — SIMETHICONE 80 MG PO CHEW: 80.0000 mg | CHEWABLE_TABLET | ORAL | Status: DC | PRN

## 2014-08-27 NOTE — Anesthesia Postprocedure Evaluation (Signed)
  Anesthesia Post-op Note  Patient: Isabella Landry  Procedure(s) Performed: * No procedures listed *  Patient Location: PACU and Mother/Baby  Anesthesia Type:Epidural  Level of Consciousness: awake, alert  and oriented  Airway and Oxygen Therapy: Patient Spontanous Breathing  Post-op Pain: none  Post-op Assessment: Post-op Vital signs reviewed, Patient's Cardiovascular Status Stable, No headache, No backache, No residual numbness and No residual motor weakness  Post-op Vital Signs: Reviewed and stable  Complications: No apparent anesthesia complications

## 2014-08-28 MED ORDER — IBUPROFEN 100 MG/5ML PO SUSP: 600.0000 mg | Freq: Four times a day (QID) | ORAL | Status: DC | PRN

## 2014-08-28 NOTE — Discharge Summary (Signed)
Obstetric Discharge Summary Reason for Admission: induction of labor for postdates Prenatal Procedures: none Intrapartum Procedures: spontaneous vaginal delivery Postpartum Procedures: none Complications-Operative and Postpartum: none HEMOGLOBIN  Date Value Ref Range Status  08/26/2014 11.7* 12.0 - 15.0 g/dL Final   HCT  Date Value Ref Range Status  08/26/2014 35.0* 36.0 - 46.0 % Final   Isabella Landry is a 19yo G3P0020 admited @ 41.0wks for IOL due to postdates. She had cx ripening w/ cytotec x 2 doses, second of which put her into labor. She had SROM at approx 5-6cm followed by precipitous dilation resulting in some FHR decels which stabilized allowing for SVD. By PPD#2 she is doing well and is ready for d/c. She is breastfeeding and desires Depo for contraception.  Physical Exam:  General: alert, cooperative and no distress  Heart: RRR Lungs: nl effort Lochia: appropriate Uterine Fundus: firm DVT Evaluation: No evidence of DVT seen on physical exam.  Discharge Diagnoses: Term Pregnancy-delivered  Discharge Information: Date: 08/28/2014 Activity: pelvic rest Diet: routine Medications: PNV and Ibuprofen Condition: stable Instructions: refer to practice specific booklet Discharge to: home Follow-up Information    [facility] up with [facility] [place]. Schedule an appointment as soon as possible for a visit in 4 weeks.   Specialty:  Obstetrics and Gynecology   Why:  For your postpartum appointment.   Contact information:   [address], [address] [place] [address] [phone]      Newborn Data: Live born female  Birth Weight: 7 lb 8.6 oz (3420 g) APGAR: 6, 8  Home with mother.  Cam Hai CNM 08/28/2014, 9:45 AM

## 2014-08-28 NOTE — Discharge Instructions (Signed)

## 2014-08-28 NOTE — Progress Notes (Signed)
Pt d/c home with baby. Education paperwork reviewed prior to departure. Pt, FOB and grandmother verbalize understanding. Pt walked to entrance by NT

## 2014-08-29 NOTE — Progress Notes (Signed)
Patient Details  Name: Isabella Landry MRN: [id number] Date of Birth: 08/26/2016  Date: 08/29/2014  Clinical Social Worker Initiating Note: Isabella Markwood BevelDate/ Time Initiated: 08/28/14/1230   Child's Name: Isabella Landry   Legal Guardian:  (Parents Isabella Landry and Isabella Landry)   Need for Interpreter: None   Date of Referral: 08/27/14   Reason for Referral: Other (Comment)   Referral Source: [facility]   Address: [address] [place], [place] [address]  Phone number:  [phone])   Household Members: Relatives   Natural Supports (not living in the home): Spouse/significant other   Professional Supports:None   Employment: (FOB is employed)   Type of Work:     Education:     Museum/gallery curator Resources:Medicaid, Multimedia programmer   Other Resources: [employer], Physicist, medical    Cultural/Religious Considerations Which May Impact Care: None noted  Strengths: Ability to meet basic needs , Home prepared for child , Compliance with medical plan    Risk Factors/Current Problems:  (Hx of marijuana use)   Cognitive State: Alert    Mood/Affect: Happy , Interested    CSW Assessment: Acknowledged order for Social Work consult to assess mother's hx of anxiety and substance abuse. Met with both parents and maternal grandmother. MOB requested her mother remain present during social work visit. Parents reside together and have no other dependents. FOB is reportedly very supportive. He also participated in the discussion. Mother acknowledged hx of anxiety. Informed that she was hospitalized in a psychiatric hospital once at age 19. She was also prescribed Zoloft for a short period of time. She denies any current symptoms of depression or anxiety. Mother admits to use of marijuana in the past. She states that once she was informed of the pregnancy, she quit immediately. She denies any need for treatment. UDS on  newborn was negative. No acute social concerns related at this time. Mother informed of social work Fish farm manager.   CSW Plan/Description:    Spoke with her about the signs/symptoms of PP Depression, where to get treatment if needed.  Discussed the hospital's drug screen policy Will continue to monitor drug screen. No current barriers to discharge    Paytan Recine J, LCSW 08/29/2014, 12:55 PM

## 2015-12-13 IMAGING — US US MFM FETAL NUCHAL TRANSLUCENCY
1 series · 13 of 28 positions shown · non-contrast
Comparison: none

[Series 1: us mfm fetal nuchal translucency · 0.23mm/px · 13 of 37 slices shown]
[im 2/37]
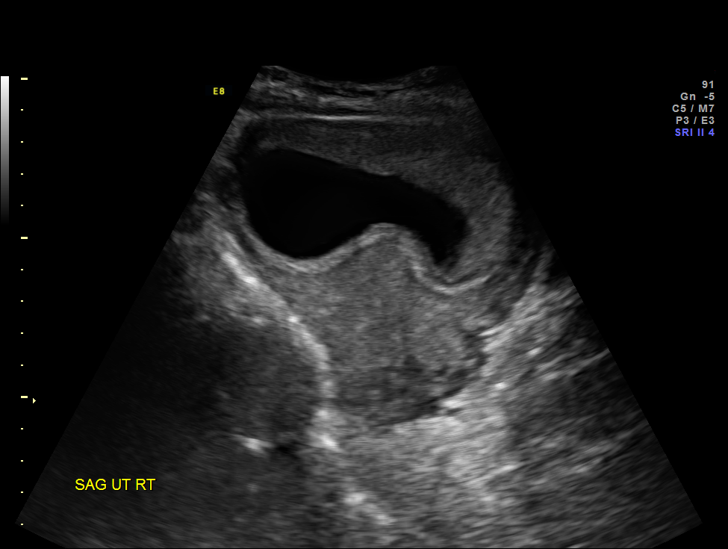
[im 5/37]
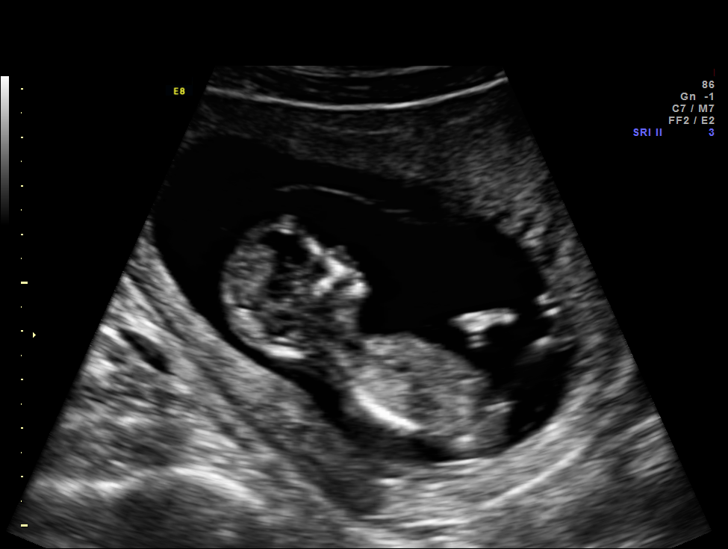
[im 7/37]
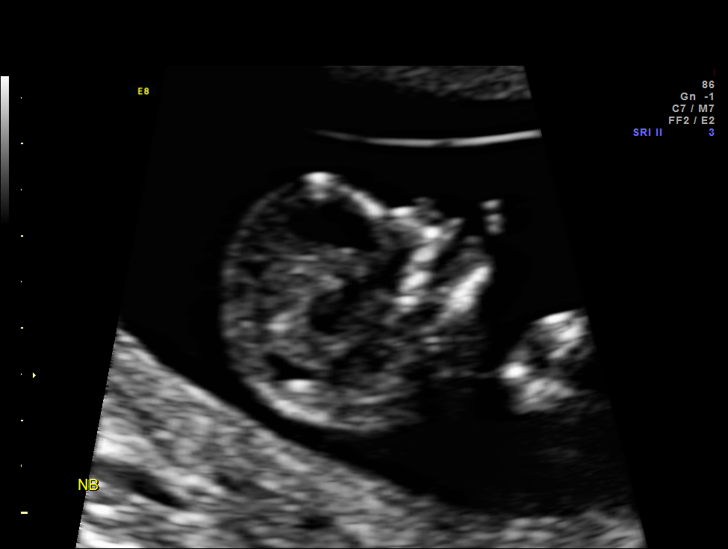
[im 10/37]
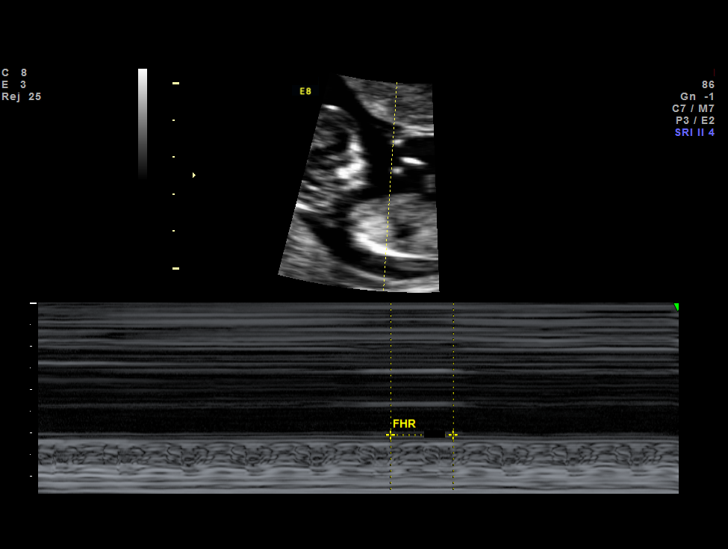
[im 13/37]
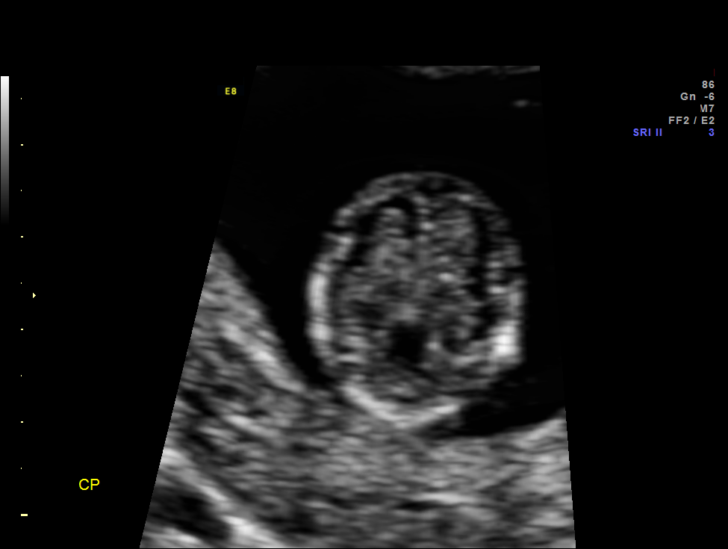
[im 15/37]
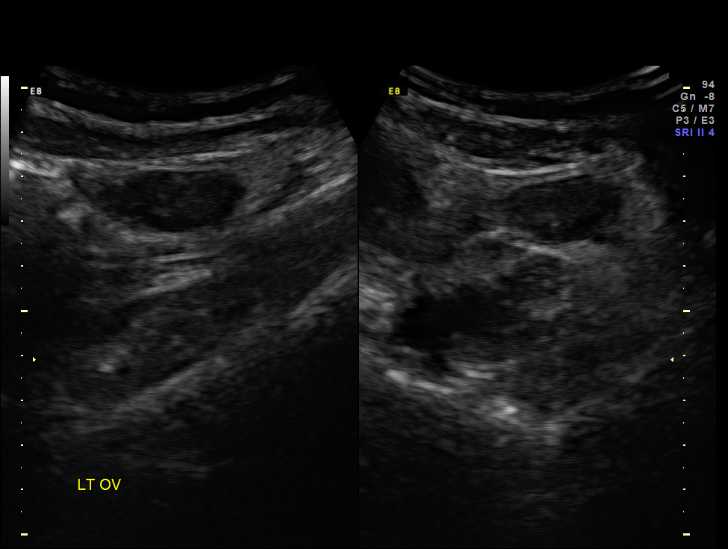
[im 19/37]
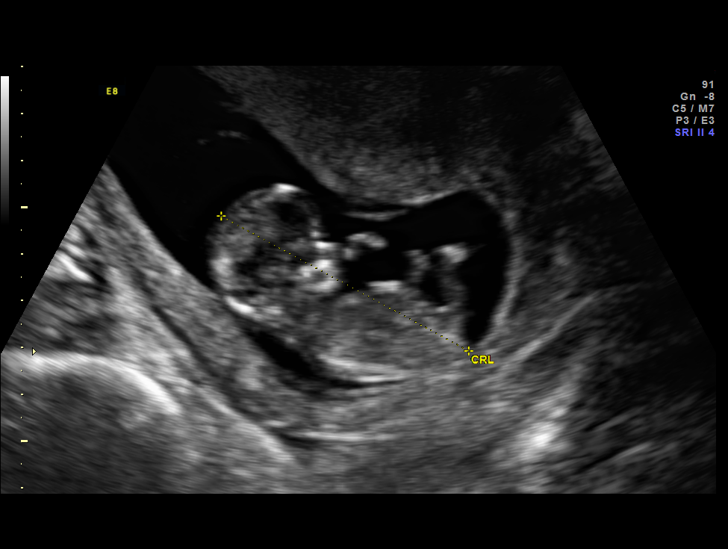
[im 22/37]
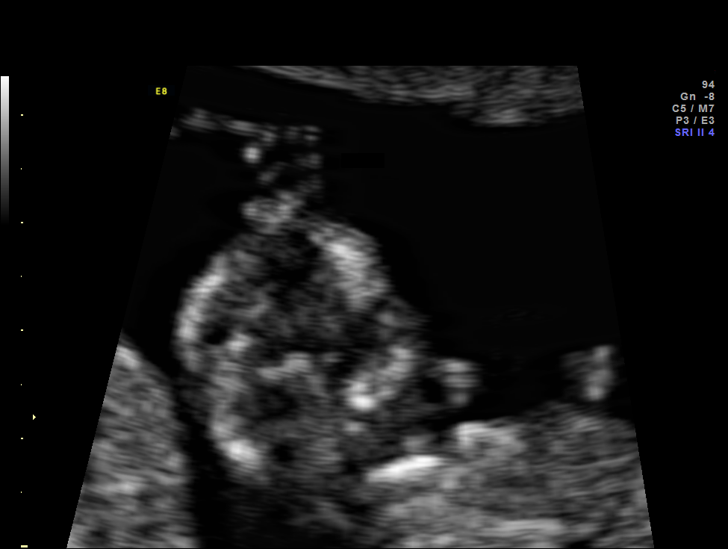
[im 25/37]
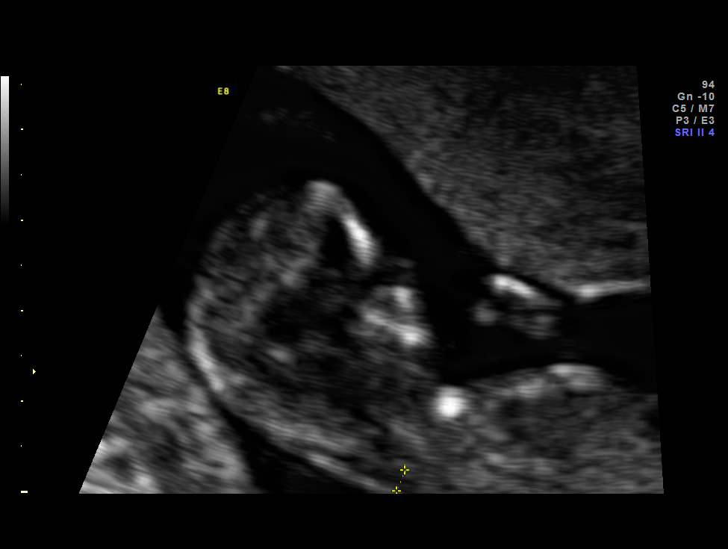
[im 27/37]
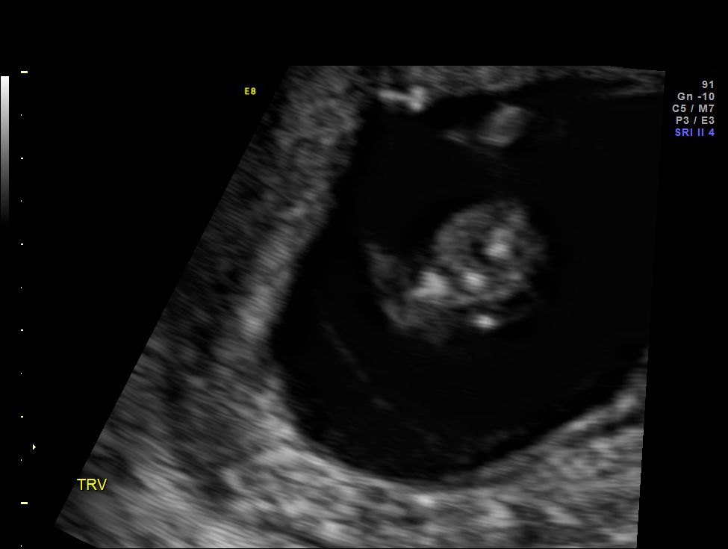
[im 30/37]
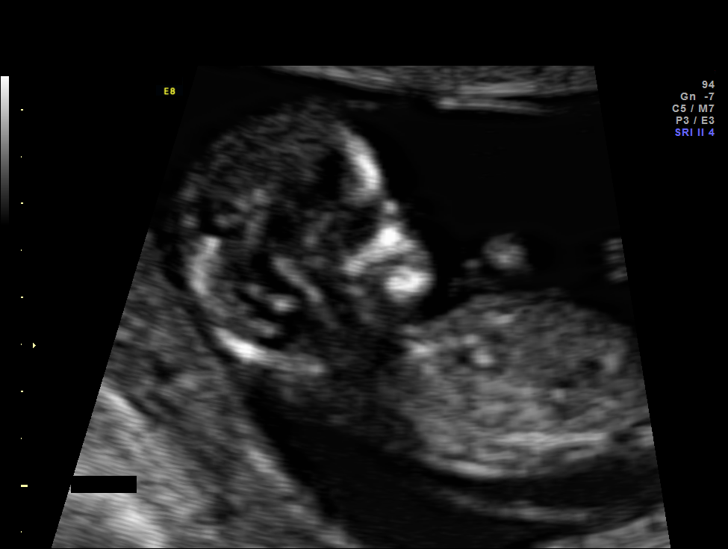
[im 33/37]
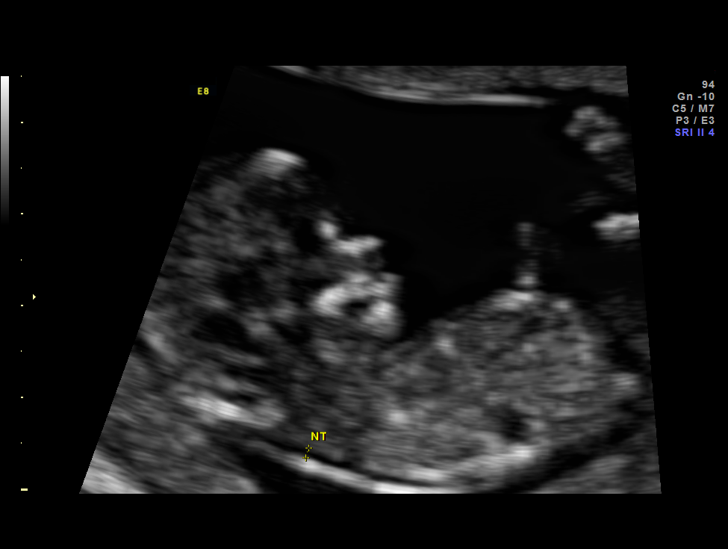
[im 35/37]
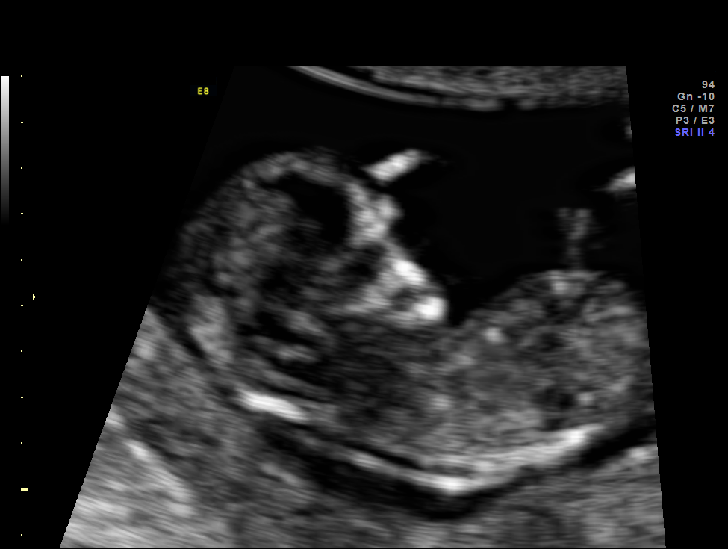

[13 of 28 positions shown; findings below may reference images not displayed]

OBSTETRICS REPORT
                      (Signed Final 02/08/2014 [DATE])

Service(s) Provided

Indications

 Marijuana Abuse
 First trimester aneuploidy screen (NT)                Z36
Fetal Evaluation

 Num Of Fetuses:    1
 Fetal Heart Rate:  161                          bpm
 Cardiac Activity:  Observed
Biometry

 CRL:     60.6  mm     G. Age:  12w 3d                 EDD:    08/20/14
 NT:      1.35  mm
Gestational Age

 LMP:           12w 4d        Date:  11/12/13                 EDD:   08/19/14
 Best:          12w 4d     Det. By:  LMP  (11/12/13)          EDD:   08/19/14
1st Trimester Genetic Sonogram Screening

 CRL:              61  mm    G. Age:   12w 3d                 EDD:   08/20/14
 Nuc Trans:       1.6  mm
 Nasal Bone:                 Present
Cervix Uterus Adnexa

 Cervix:       Closed.
 Uterus:       No abnormality visualized.
 Cul De Sac:   No free fluid seen.

 Left Ovary:    Within normal limits.
 Right Ovary:   Within normal limits.
 Adnexa:     No abnormality visualized.
Impression

 Single IUP at 12w 4d
 Normal NT (1.6 mm).  Nasal bone visualized.
 First trimester aneuploidy screen performed as noted above.
Recommendations

 Please do not draw triple/quad screen, though patient should
 be offered MSAFP for neural tube defect screening.
 Recommend ultrasound for fetal anatomy at 18-20 weeks
 gestation

## 2016-01-31 ENCOUNTER — Encounter: Payer: BLUE CROSS/BLUE SHIELD | Admitting: Obstetrics & Gynecology

## 2016-01-31 DIAGNOSIS — Z3401 Encounter for supervision of normal first pregnancy, first trimester: Secondary | ICD-10-CM

## 2016-01-31 IMAGING — US US OB COMP +14 WK
1 series · 12 of 28 positions shown · non-contrast
Comparison: none

[Series 1: us ob comp +14 wk mfm · 123 acquisitions, 12 frames shown]
[im 5/123]
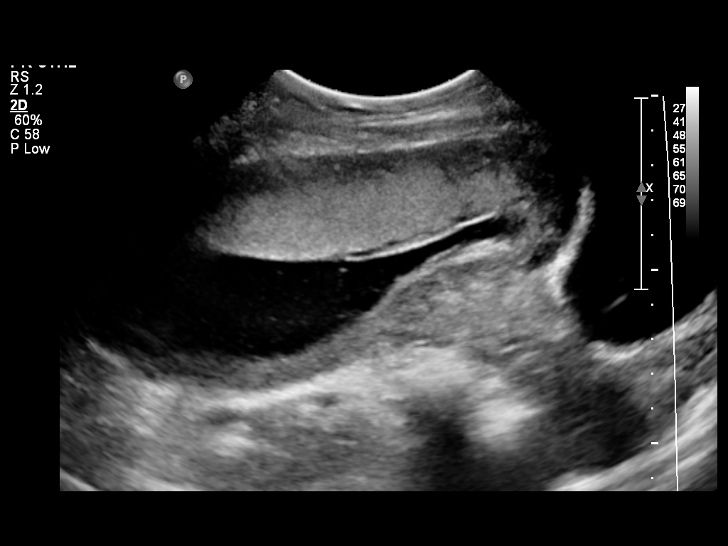
[im 14/123]
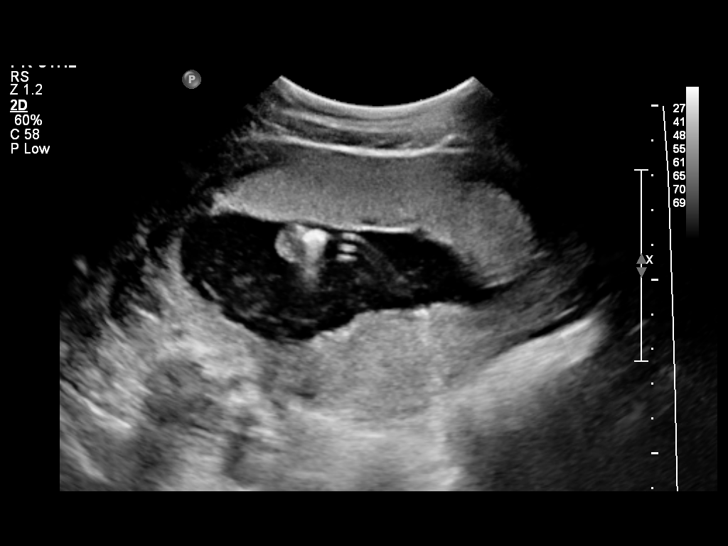
[im 23/123]
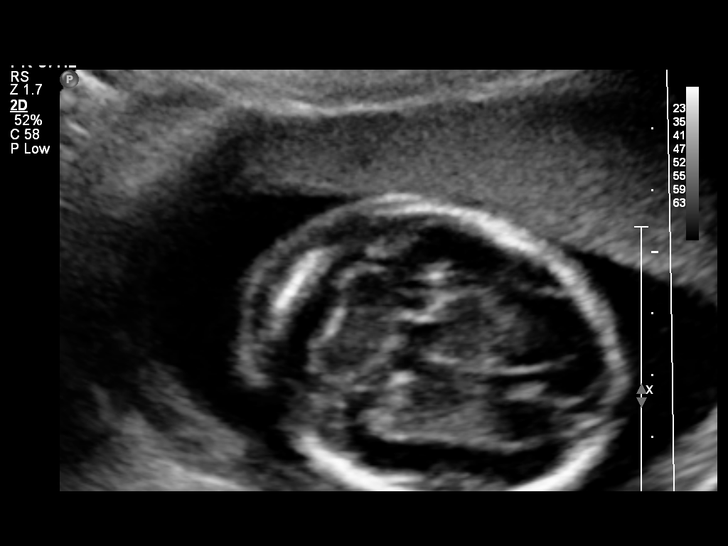
[im 37/123]
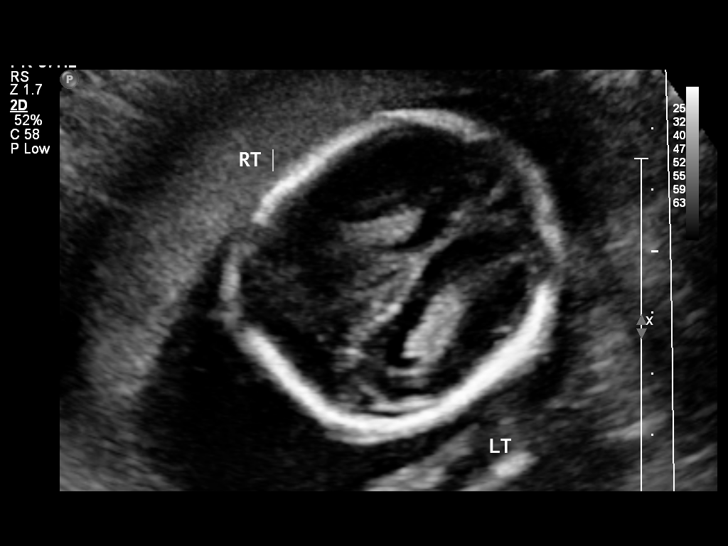
[im 46/123]
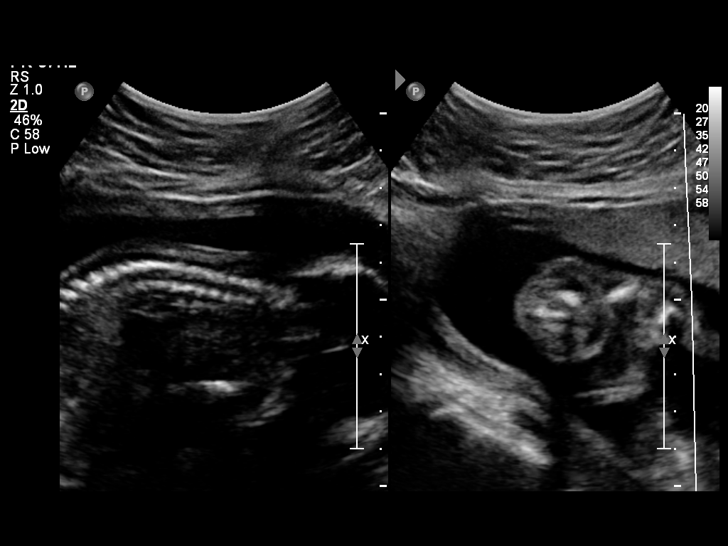
[im 55/123]
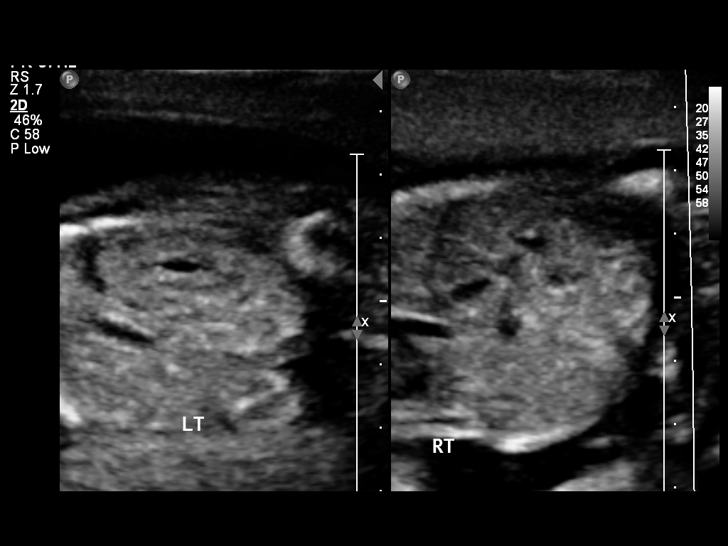
[im 68/123]
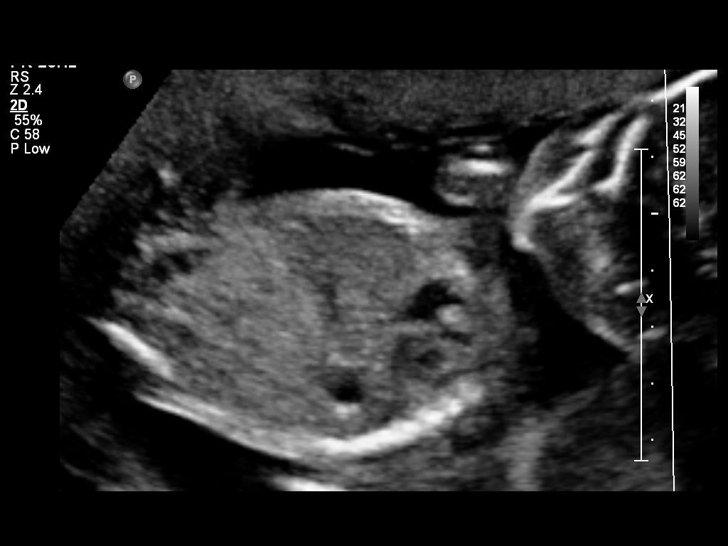
[im 77/123]
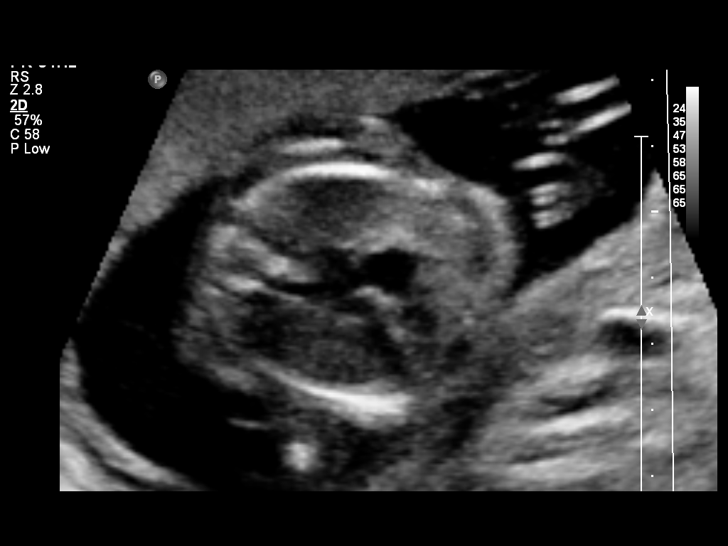
[im 86/123]
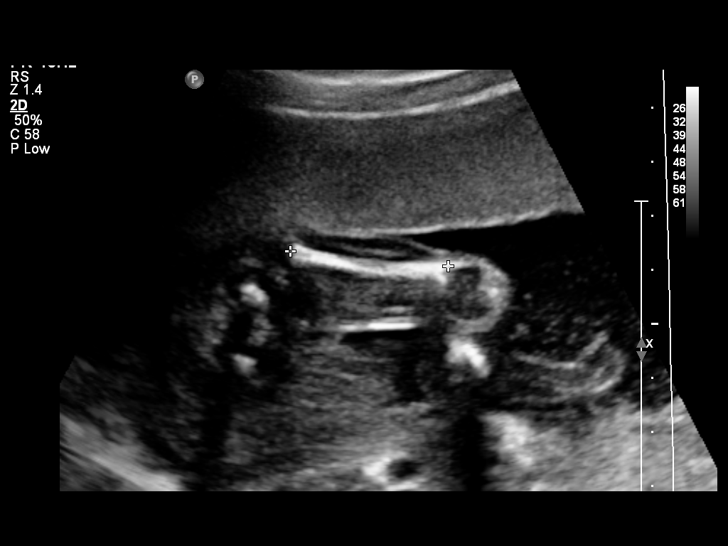
[im 100/123]
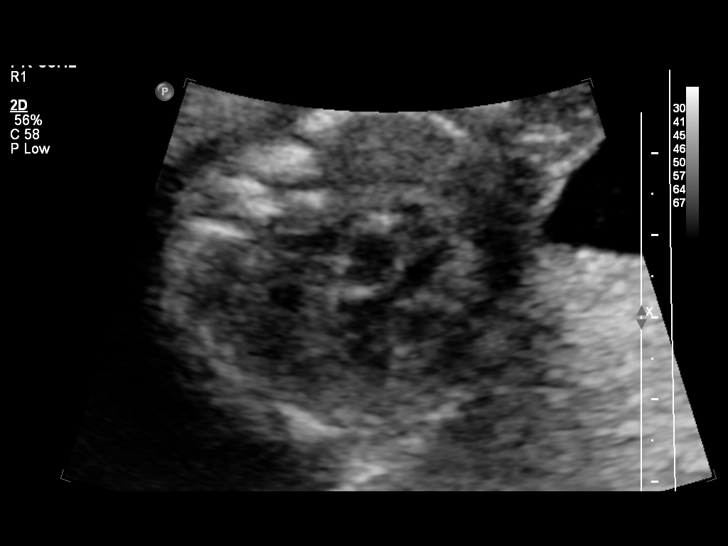
[im 109/123]
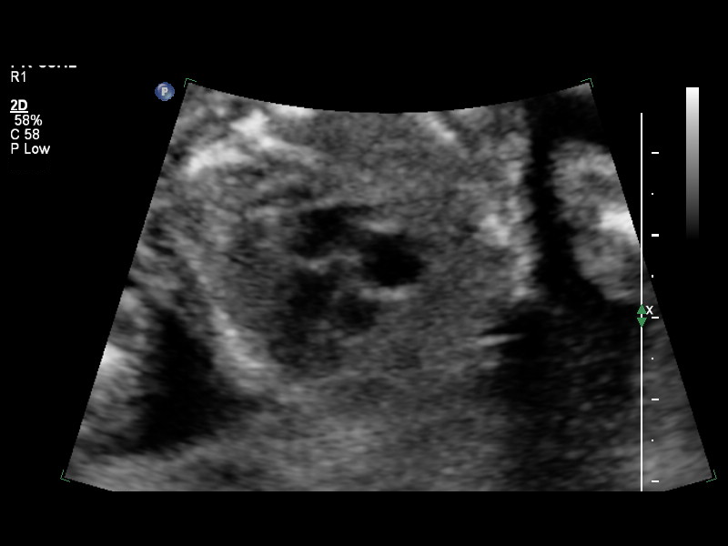
[im 118/123]
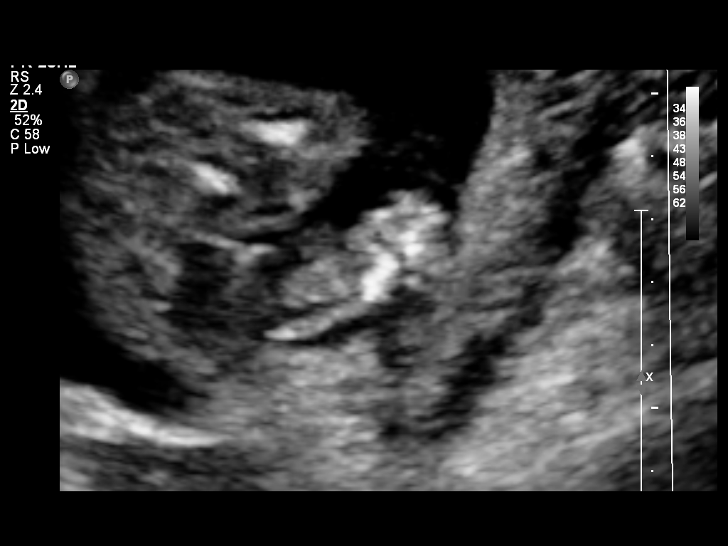

[12 of 28 positions shown; findings below may reference images not displayed]

OBSTETRICS REPORT
                      (Signed Final 03/29/2014 [DATE])

Service(s) Provided

 US OB COMP + 14 WK                                    76805.1
Indications

 19 weeks gestation of pregnancy
 Basic anatomic survey                                 [id number]
Fetal Evaluation

 Num Of Fetuses:    1
 Fetal Heart Rate:  139                          bpm
 Cardiac Activity:  Observed
 Presentation:      Variable
 Placenta:          Anterior, above cervical os
 P. Cord            Visualized, central
 Insertion:

 Amniotic Fluid
 AFI FV:      Subjectively within normal limits
                                             Larg Pckt:    3.93  cm
Biometry

 BPD:     45.8  mm     G. Age:  19w 6d                CI:         78.4   70 - 86
 OFD:     58.4  mm                                    FL/HC:      17.5   16.8 -

 HC:     169.4  mm     G. Age:  19w 4d       43  %    HC/AC:      1.14   1.09 -

 AC:       148  mm     G. Age:  20w 1d       62  %    FL/BPD:
 FL:      29.6  mm     G. Age:  19w 1d       27  %    FL/AC:      20.0   20 - 24
 HUM:     29.2  mm     G. Age:  19w 4d       51  %
 CER:     19.7  mm     G. Age:  18w 6d       34  %
 NFT:     4.68  mm

 Est. FW:     304  gm    0 lb 11 oz      49  %
Gestational Age

 LMP:           19w 4d        Date:  11/12/13                 EDD:   08/19/14
 U/S Today:     19w 5d                                        EDD:   08/18/14
 Best:          19w 4d     Det. By:  LMP  (11/12/13)          EDD:   08/19/14
Anatomy
 Cranium:          Appears normal         Aortic Arch:      Appears normal
 Fetal Cavum:      Appears normal         Ductal Arch:      Limited views but
                                                            appeared normal
 Ventricles:       Appears normal         Diaphragm:        Appears normal
 Choroid Plexus:   Appears normal         Stomach:          Appears normal, left
                                                            sided
 Cerebellum:       Appears normal         Abdomen:          Appears normal
 Posterior Fossa:  Appears normal         Abdominal Wall:   Appears nml (cord
                                                            insert, abd wall)
 Nuchal Fold:      Appears normal         Cord Vessels:     Appears normal (3
                                                            vessel cord)
 Face:             Appears normal         Kidneys:          Appear normal
                   (orbits and profile)
 Lips:             Appears normal         Bladder:          Appears normal
 Heart:            Appears normal         Spine:            Appears normal
                   (4CH, axis, and
                   situs)
 RVOT:             Not well visualized    Lower             Appears normal
                                          Extremities:
 LVOT:             Appears normal         Upper             Appears normal
                                          Extremities:

 Other:  Heels and 5th digit visualized. Fetus appears to be a male. Nasal
         bone visualized.
Targeted Anatomy

 Fetal Central Nervous System
 Lat. Ventricles:  6.9                    Cisterna Magna:
Cervix Uterus Adnexa

 Cervical Length:    3.39     cm

 Cervix:       Normal appearance by transabdominal scan.
 Uterus:       No abnormality visualized.
 Cul De Sac:   No free fluid seen.
 Left Ovary:    No adnexal mass visualized.
 Right Ovary:   No adnexal mass visualized.

 Adnexa:     No abnormality visualized.
Impression

 SIUP at 19+4 weeks
 Normal detailed fetal anatomy; limited views of RVOT
 Markers of aneuploidy: none
 Normal amniotic fluid volume
 Measurements consistent with LMP dating
Recommendations

 Follow-up as clinically indicated

## 2016-02-14 ENCOUNTER — Encounter (INDEPENDENT_AMBULATORY_CARE_PROVIDER_SITE_OTHER): Payer: Self-pay

## 2016-02-14 ENCOUNTER — Other Ambulatory Visit (HOSPITAL_COMMUNITY)
Admission: RE | Admit: 2016-02-14 | Discharge: 2016-02-14 | Disposition: A | Discharge status: Home / Self Care | Payer: BLUE CROSS/BLUE SHIELD | Source: Ambulatory Visit | Attending: Obstetrics and Gynecology | Admitting: Obstetrics and Gynecology

## 2016-02-14 ENCOUNTER — Ambulatory Visit (INDEPENDENT_AMBULATORY_CARE_PROVIDER_SITE_OTHER): Payer: BLUE CROSS/BLUE SHIELD | Admitting: Obstetrics and Gynecology

## 2016-02-14 ENCOUNTER — Encounter: Payer: Self-pay | Admitting: Obstetrics and Gynecology

## 2016-02-14 VITALS — BP 112/70 | HR 92 | Wt 108.0 lb

## 2016-02-14 DIAGNOSIS — A749 Chlamydial infection, unspecified: Secondary | ICD-10-CM

## 2016-02-14 DIAGNOSIS — Z113 Encounter for screening for infections with a predominantly sexual mode of transmission: Secondary | ICD-10-CM | POA: Insufficient documentation

## 2016-02-14 DIAGNOSIS — Z3482 Encounter for supervision of other normal pregnancy, second trimester: Secondary | ICD-10-CM | POA: Diagnosis not present

## 2016-02-14 DIAGNOSIS — Z3402 Encounter for supervision of normal first pregnancy, second trimester: Secondary | ICD-10-CM

## 2016-02-14 DIAGNOSIS — Z3689 Encounter for other specified antenatal screening: Secondary | ICD-10-CM

## 2016-02-14 NOTE — Progress Notes (Signed)
Subjective:  Isabella Landry is a 21 y.o. [phone] at 6968w1d being seen today for first OB visit. She has no complaints today. No chronic medical problems. TSVD in 2016 without problems .  She is currently monitored for the following issues for this low-risk pregnancy and has Supervision of low-risk first pregnancy on her problem list.  Patient reports no complaints.   . Vag. Bleeding: None.  Movement: Present. Denies leaking of fluid.   The following portions of the patient's history were reviewed and updated as appropriate: allergies, current medications, past family history, past medical history, past social history, past surgical history and problem list. Problem list updated.  Objective:   Vitals:   02/14/16 1405  BP: 112/70  Pulse: 92  Weight: 108 lb (49 kg)    Fetal Status: Fetal Heart Rate (bpm): 130   Movement: Present     General:  Alert, oriented and cooperative. Patient is in no acute distress.  Skin: Skin is warm and dry. No rash noted.   Cardiovascular: Normal heart rate noted  Respiratory: Normal respiratory effort, no problems with respiration noted  Abdomen: Soft, gravid, appropriate for gestational age.       Pelvic:  Cervical exam performed        Extremities: Normal range of motion.     Mental Status: Normal mood and affect. Normal behavior. Normal judgment and thought content.  Breast Sym supple no nipple d/c masses or adenopathy  Urinalysis: Urine Protein: Negative Urine Glucose: Negative  Assessment and Plan:  Pregnancy: G4P1021 at 5168w1d  1. Encounter for supervision of low-risk first pregnancy in second trimester Prenatal care and labs reviewed with pt.  - Prenatal Profile - HgB A1c - Culture, OB Urine - GC/Chlamydia probe amp ([place] - Pain Mgmt, Profile 6 Conf w/o mM, U - Glucose - US MFM OB COMP + 14 WK; Future  Preterm labor symptoms and general obstetric precautions including but not limited to vaginal bleeding, contractions,  leaking of fluid and fetal movement were reviewed in detail with the patient. Please refer to After Visit Summary for other counseling recommendations.  Return in about 4 weeks (around 03/13/2016) for OB visit.   Hermina StaggersMichael L Onalee Steinbach, MD

## 2016-02-14 NOTE — Patient Instructions (Signed)
Second Trimester of Pregnancy The second trimester is from week 13 through week 28 (months 4 through 6). The second trimester is often a time when you feel your best. Your body has also adjusted to being pregnant, and you begin to feel better physically. Usually, morning sickness has lessened or quit completely, you may have more energy, and you may have an increase in appetite. The second trimester is also a time when the fetus is growing rapidly. At the end of the sixth month, the fetus is about 9 inches long and weighs about 1 pounds. You will likely begin to feel the baby move (quickening) between 18 and 20 weeks of the pregnancy. Body changes during your second trimester Your body continues to go through many changes during your second trimester. The changes vary from woman to woman.  Your weight will continue to increase. You will notice your lower abdomen bulging out.  You may begin to get stretch marks on your hips, abdomen, and breasts.  You may develop headaches that can be relieved by medicines. The medicines should be approved by your health care provider.  You may urinate more often because the fetus is pressing on your bladder.  You may develop or continue to have heartburn as a result of your pregnancy.  You may develop constipation because certain hormones are causing the muscles that push waste through your intestines to slow down.  You may develop hemorrhoids or swollen, bulging veins (varicose veins).  You may have back pain. This is caused by:  Weight gain.  Pregnancy hormones that are relaxing the joints in your pelvis.  A shift in weight and the muscles that support your balance.  Your breasts will continue to grow and they will continue to become tender.  Your gums may bleed and may be sensitive to brushing and flossing.  Dark spots or blotches (chloasma, mask of pregnancy) may develop on your face. This will likely fade after the baby is born.  A dark line  from your belly button to the pubic area (linea nigra) may appear. This will likely fade after the baby is born.  You may have changes in your hair. These can include thickening of your hair, rapid growth, and changes in texture. Some women also have hair loss during or after pregnancy, or hair that feels dry or thin. Your hair will most likely return to normal after your baby is born. What to expect at prenatal visits During a routine prenatal visit:  You will be weighed to make sure you and the fetus are growing normally.  Your blood pressure will be taken.  Your abdomen will be measured to track your baby's growth.  The fetal heartbeat will be listened to.  Any test results from the previous visit will be discussed. Your health care provider may ask you:  How you are feeling.  If you are feeling the baby move.  If you have had any abnormal symptoms, such as leaking fluid, bleeding, severe headaches, or abdominal cramping.  If you are using any tobacco products, including cigarettes, chewing tobacco, and electronic cigarettes.  If you have any questions. Other tests that may be performed during your second trimester include:  Blood tests that check for:  Low iron levels (anemia).  Gestational diabetes (between 24 and 28 weeks).  Rh antibodies. This is to check for a protein on red blood cells (Rh factor).  Urine tests to check for infections, diabetes, or protein in the urine.  An ultrasound to   confirm the proper growth and development of the baby.  An amniocentesis to check for possible genetic problems.  Fetal screens for spina bifida and Down syndrome.  HIV (human immunodeficiency virus) testing. Routine prenatal testing includes screening for HIV, unless you choose not to have this test. Follow these instructions at home: Eating and drinking  Continue to eat regular, healthy meals.  Avoid raw meat, uncooked cheese, cat litter boxes, and soil used by cats. These  carry germs that can cause birth defects in the baby.  Take your prenatal vitamins.  Take 1500-2000 mg of calcium daily starting at the 20th week of pregnancy until you deliver your baby.  If you develop constipation:  Take over-the-counter or prescription medicines.  Drink enough fluid to keep your urine clear or pale yellow.  Eat foods that are high in fiber, such as fresh fruits and vegetables, whole grains, and beans.  Limit foods that are high in fat and processed sugars, such as fried and sweet foods. Activity  Exercise only as directed by your health care provider. Experiencing uterine cramps is a good sign to stop exercising.  Avoid heavy lifting, wear low heel shoes, and practice good posture.  Wear your seat belt at all times when driving.  Rest with your legs elevated if you have leg cramps or low back pain.  Wear a good support bra for breast tenderness.  Do not use hot tubs, steam rooms, or saunas. Lifestyle  Avoid all smoking, herbs, alcohol, and unprescribed drugs. These chemicals affect the formation and growth of the baby.  Do not use any products that contain nicotine or tobacco, such as cigarettes and e-cigarettes. If you need help quitting, ask your health care provider.  A sexual relationship may be continued unless your health care provider directs you otherwise. General instructions  Follow your health care provider's instructions regarding medicine use. There are medicines that are either safe or unsafe to take during pregnancy.  Take warm sitz baths to soothe any pain or discomfort caused by hemorrhoids. Use hemorrhoid cream if your health care provider approves.  If you develop varicose veins, wear support hose. Elevate your feet for 15 minutes, 3-4 times a day. Limit salt in your diet.  Visit your dentist if you have not gone yet during your pregnancy. Use a soft toothbrush to brush your teeth and be gentle when you floss.  Keep all follow-up  prenatal visits as told by your health care provider. This is important. Contact a health care provider if:  You have dizziness.  You have mild pelvic cramps, pelvic pressure, or nagging pain in the abdominal area.  You have persistent nausea, vomiting, or diarrhea.  You have a bad smelling vaginal discharge.  You have pain with urination. Get help right away if:  You have a fever.  You are leaking fluid from your vagina.  You have spotting or bleeding from your vagina.  You have severe abdominal cramping or pain.  You have rapid weight gain or weight loss.  You have shortness of breath with chest pain.  You notice sudden or extreme swelling of your face, hands, ankles, feet, or legs.  You have not felt your baby move in over an hour.  You have severe headaches that do not go away with medicine.  You have vision changes. Summary  The second trimester is from week 13 through week 28 (months 4 through 6). It is also a time when the fetus is growing rapidly.  Your body goes   through many changes during pregnancy. The changes vary from woman to woman.  Avoid all smoking, herbs, alcohol, and unprescribed drugs. These chemicals affect the formation and growth your baby.  Do not use any tobacco products, such as cigarettes, chewing tobacco, and e-cigarettes. If you need help quitting, ask your health care provider.  Contact your health care provider if you have any questions. Keep all prenatal visits as told by your health care provider. This is important. This information is not intended to replace advice given to you by your health care provider. Make sure you discuss any questions you have with your health care provider. Document Released: 12/26/2000 Document Revised: 06/09/2015 Document Reviewed: 03/04/2012 Elsevier Interactive Patient Education  2017 Elsevier Inc.  

## 2016-02-14 NOTE — Progress Notes (Signed)
Bedside U/S shows IUP with FHT of 130 BPM and HC is 166.54mm  GA 5633w2d

## 2016-02-15 LAB — PRENATAL PROFILE (SOLSTAS)
ANTIBODY SCREEN: NEGATIVE
BASOS ABS: 0 {cells}/uL (ref 0–200)
BASOS PCT: 0 %
EOS PCT: 1 %
Eosinophils Absolute: 134 cells/uL (ref 15–500)
HEMATOCRIT: 36.7 % (ref 35.0–45.0)
HEMOGLOBIN: 12.2 g/dL (ref 11.7–15.5)
HEP B S AG: NEGATIVE
HIV 1&2 Ab, 4th Generation: NONREACTIVE
LYMPHS ABS: 2814 {cells}/uL (ref 850–3900)
Lymphocytes Relative: 21 %
MCH: 31.1 pg (ref 27.0–33.0)
MCHC: 33.2 g/dL (ref 32.0–36.0)
MCV: 93.6 fL (ref 80.0–100.0)
MPV: 9.8 fL (ref 7.5–12.5)
Monocytes Absolute: 938 cells/uL (ref 200–950)
Monocytes Relative: 7 %
NEUTROS ABS: 9514 {cells}/uL — AB (ref 1500–7800)
Neutrophils Relative %: 71 %
Platelets: 272 10*3/uL (ref 140–400)
RBC: 3.92 MIL/uL (ref 3.80–5.10)
RDW: 13.9 % (ref 11.0–15.0)
Rh Type: POSITIVE
Rubella: 3.74 Index — ABNORMAL HIGH (ref <0.90)
WBC: 13.4 10*3/uL — AB (ref 3.8–10.8)

## 2016-02-15 LAB — HEMOGLOBIN A1C
Hgb A1c MFr Bld: 4.7 % (ref <5.7)
Mean Plasma Glucose: 88 mg/dL

## 2016-02-15 LAB — GLUCOSE, RANDOM: Glucose, Bld: 83 mg/dL (ref 65–99)

## 2016-02-16 LAB — CULTURE, OB URINE
Colony Count: NO GROWTH
Organism ID, Bacteria: NO GROWTH

## 2016-02-16 LAB — GC/CHLAMYDIA PROBE AMP (~~LOC~~) NOT AT ARMC
Chlamydia: POSITIVE — AB
Neisseria Gonorrhea: NEGATIVE

## 2016-02-17 ENCOUNTER — Telehealth: Payer: Self-pay

## 2016-02-17 MED ORDER — AZITHROMYCIN 250 MG PO TABS: 250.0000 mg | ORAL_TABLET | Freq: Once | ORAL | 0 refills | Status: AC

## 2016-02-17 NOTE — Addendum Note (Signed)
Addended by: Kathie DikeSOLA, Deavon Podgorski J on: 02/17/2016 08:12 AM   Modules accepted: Orders

## 2016-02-17 NOTE — Telephone Encounter (Signed)
I spoke with pt and she is aware that she has Chlamydia. I advised that her partner get tested and treated if needed. I informed her that the medication has been sent into her pharmacy. I also informed her that we will need to do a TOC in four weeks.

## 2016-02-18 LAB — PAIN MGMT, PROFILE 6 CONF W/O MM, U
6 Acetylmorphine: NEGATIVE ng/mL (ref <10)
ALCOHOL METABOLITES: NEGATIVE ng/mL (ref <500)
Amphetamines: NEGATIVE ng/mL (ref <500)
BARBITURATES: NEGATIVE ng/mL (ref <300)
BENZODIAZEPINES: NEGATIVE ng/mL (ref <100)
Cocaine Metabolite: NEGATIVE ng/mL (ref <150)
Creatinine: 68 mg/dL (ref >=20.0)
MARIJUANA METABOLITE: 374 ng/mL — AB (ref <5)
METHADONE METABOLITE: NEGATIVE ng/mL (ref <100)
Marijuana Metabolite: POSITIVE ng/mL — AB (ref <20)
OPIATES: NEGATIVE ng/mL (ref <100)
OXYCODONE: NEGATIVE ng/mL (ref <100)
Oxidant: NEGATIVE ug/mL (ref <200)
PLEASE NOTE: 0
Phencyclidine: NEGATIVE ng/mL (ref <25)
pH: 7.07 (ref 4.5–9.0)

## 2016-02-23 ENCOUNTER — Other Ambulatory Visit: Payer: Self-pay | Admitting: Obstetrics and Gynecology

## 2016-02-23 ENCOUNTER — Ambulatory Visit (HOSPITAL_COMMUNITY)
Admission: RE | Admit: 2016-02-23 | Discharge: 2016-02-23 | Disposition: A | Discharge status: Home / Self Care | Payer: BLUE CROSS/BLUE SHIELD | Source: Ambulatory Visit | Attending: Obstetrics and Gynecology | Admitting: Obstetrics and Gynecology

## 2016-02-23 DIAGNOSIS — O0932 Supervision of pregnancy with insufficient antenatal care, second trimester: Secondary | ICD-10-CM | POA: Insufficient documentation

## 2016-02-23 DIAGNOSIS — Z3A21 21 weeks gestation of pregnancy: Secondary | ICD-10-CM

## 2016-02-23 DIAGNOSIS — Z363 Encounter for antenatal screening for malformations: Secondary | ICD-10-CM | POA: Diagnosis present

## 2016-02-23 DIAGNOSIS — Z369 Encounter for antenatal screening, unspecified: Secondary | ICD-10-CM

## 2016-02-23 DIAGNOSIS — Z3402 Encounter for supervision of normal first pregnancy, second trimester: Secondary | ICD-10-CM

## 2016-03-02 ENCOUNTER — Ambulatory Visit: Payer: BLUE CROSS/BLUE SHIELD | Admitting: Sports Medicine

## 2016-03-13 ENCOUNTER — Ambulatory Visit (INDEPENDENT_AMBULATORY_CARE_PROVIDER_SITE_OTHER): Payer: BLUE CROSS/BLUE SHIELD | Admitting: Obstetrics & Gynecology

## 2016-03-13 ENCOUNTER — Encounter: Payer: BLUE CROSS/BLUE SHIELD | Admitting: Obstetrics & Gynecology

## 2016-03-13 VITALS — BP 114/70 | HR 94 | Wt 113.0 lb

## 2016-03-13 DIAGNOSIS — Z3482 Encounter for supervision of other normal pregnancy, second trimester: Secondary | ICD-10-CM

## 2016-03-13 DIAGNOSIS — Z3402 Encounter for supervision of normal first pregnancy, second trimester: Secondary | ICD-10-CM

## 2016-03-13 DIAGNOSIS — Z3689 Encounter for other specified antenatal screening: Secondary | ICD-10-CM

## 2016-03-13 DIAGNOSIS — Z3A28 28 weeks gestation of pregnancy: Secondary | ICD-10-CM

## 2016-03-13 NOTE — Progress Notes (Signed)
   PRENATAL VISIT NOTE  Subjective:  Isabella Landry is a 21 y.o. [phone] at 534w1d being seen today for ongoing prenatal care.  She is currently monitored for the following issues for this low-risk pregnancy and has Supervision of low-risk first pregnancy on her problem list.  Patient reports no complaints.  Contractions: Not present. Vag. Bleeding: None.  Movement: Present. Denies leaking of fluid.   The following portions of the patient's history were reviewed and updated as appropriate: allergies, current medications, past family history, past medical history, past social history, past surgical history and problem list. Problem list updated.  Objective:   Vitals:   03/13/16 1348  BP: 114/70  Pulse: 94  Weight: 113 lb (51.3 kg)    Fetal Status: Fetal Heart Rate (bpm): 144   Movement: Present     General:  Alert, oriented and cooperative. Patient is in no acute distress.  Skin: Skin is warm and dry. No rash noted.   Cardiovascular: Normal heart rate noted  Respiratory: Normal respiratory effort, no problems with respiration noted  Abdomen: Soft, gravid, appropriate for gestational age. Pain/Pressure: Absent     Pelvic:  Cervical exam deferred        Extremities: Normal range of motion.  Edema: None  Mental Status: Normal mood and affect. Normal behavior. Normal judgment and thought content.   Assessment and Plan:  Pregnancy: G4P1021 at 5234w1d  1. Encounter for fetal anatomic survey -Need for f/u anatomy - US MFM OB FOLLOW UP; Future  2. Encounter for supervision of low-risk first pregnancy in second trimester -28 week labs next visit.  tdap  Preterm labor symptoms and general obstetric precautions including but not limited to vaginal bleeding, contractions, leaking of fluid and fetal movement were reviewed in detail with the patient. Please refer to After Visit Summary for other counseling recommendations.  Return in about 4 weeks (around 04/10/2016).   Lesly DukesKelly H  Darthy Manganelli, MD

## 2016-03-19 ENCOUNTER — Ambulatory Visit (HOSPITAL_COMMUNITY): Payer: BLUE CROSS/BLUE SHIELD | Attending: Obstetrics & Gynecology

## 2016-04-09 ENCOUNTER — Encounter: Payer: BLUE CROSS/BLUE SHIELD | Admitting: Advanced Practice Midwife

## 2016-04-10 ENCOUNTER — Encounter: Payer: BLUE CROSS/BLUE SHIELD | Admitting: Obstetrics & Gynecology

## 2016-04-12 NOTE — Addendum Note (Signed)
Addended by: Kathie DikeSOLA, Cartel Mauss J on: 04/12/2016 03:37 PM   Modules accepted: Orders

## 2016-04-16 ENCOUNTER — Encounter: Payer: Self-pay | Admitting: Obstetrics & Gynecology

## 2016-04-16 ENCOUNTER — Ambulatory Visit (INDEPENDENT_AMBULATORY_CARE_PROVIDER_SITE_OTHER): Payer: BLUE CROSS/BLUE SHIELD | Admitting: Advanced Practice Midwife

## 2016-04-16 ENCOUNTER — Encounter: Payer: Self-pay | Admitting: Advanced Practice Midwife

## 2016-04-16 VITALS — BP 110/67 | HR 95 | Wt 123.0 lb

## 2016-04-16 DIAGNOSIS — Z3483 Encounter for supervision of other normal pregnancy, third trimester: Secondary | ICD-10-CM

## 2016-04-16 DIAGNOSIS — Z3402 Encounter for supervision of normal first pregnancy, second trimester: Secondary | ICD-10-CM

## 2016-04-16 LAB — CBC
HEMATOCRIT: 34.5 % — AB (ref 35.0–45.0)
Hemoglobin: 11.3 g/dL — ABNORMAL LOW (ref 11.7–15.5)
MCH: 30.6 pg (ref 27.0–33.0)
MCHC: 32.8 g/dL (ref 32.0–36.0)
MCV: 93.5 fL (ref 80.0–100.0)
MPV: 9.5 fL (ref 7.5–12.5)
PLATELETS: 226 10*3/uL (ref 140–400)
RBC: 3.69 MIL/uL — ABNORMAL LOW (ref 3.80–5.10)
RDW: 13.7 % (ref 11.0–15.0)
WBC: 10 10*3/uL (ref 3.8–10.8)

## 2016-04-16 MED ORDER — FLUCONAZOLE 150 MG PO TABS: 150.0000 mg | ORAL_TABLET | Freq: Once | ORAL | 0 refills | Status: AC

## 2016-04-16 MED ORDER — AMOXICILLIN 500 MG PO CAPS: 500.0000 mg | ORAL_CAPSULE | Freq: Three times a day (TID) | ORAL | 0 refills | Status: DC

## 2016-04-16 NOTE — Patient Instructions (Signed)
Glucose Tolerance Test The glucose tolerance test (GTT) is one of several tests used to diagnose diabetes mellitus. The GTT is a blood test, and it may include a urine test as well. The GTT checks to see how your body processes sugar (glucose). For this test, you will consume a drink containing a high level of glucose. Your blood glucose levels will be checked before you consume the drink and then again 1, 2, 3, and possibly 4 hours after you consume it. Your health care provider may recommend that you have the GTT if you:  Have a family history of diabetes.  Are very overweight (obese).  Have experienced infections that keep coming back.  Have had numerous cuts or wounds that did not heal quickly, especially on your legs and feet.  Are a woman and have a history of giving birth to very large babies or a history of repeated fetal loss (stillbirth).  Have had glucose in your urine or high blood sugar: ? During pregnancy. ? After a heart attack, surgery, or prolonged periods of high stress.  The GTT lasts 3-4 hours. Other than the glucose solution, you will not be allowed to eat or drink anything during the test. You must remain at the testing location to make sure that your blood and urine samples are taken on time. How do I prepare for this test? Eat normally for 3 days prior to the GTT test, including having plenty of carbohydrate-rich foods. Do not eat or drink anything except water during the final 12 hours before the test. You should not smoke or exercise during the test. In addition, your health care provider may ask you to stop taking certain medicines before the test. What do the results mean? It is your responsibility to obtain your test results. Ask the lab or department performing the test when and how you will get your results. Contact your health care provider to discuss any questions you have about your results. Range of Normal Values Ranges for normal values may vary among  different labs and hospitals. You should always check with your health care provider after having lab work or other tests done to discuss whether your values are considered within normal limits. Normal levels of blood glucose are as follows:  Fasting: less than 110 mg/dL or less than 6.1 mmol/L (SI units).  1 hour after consuming the glucose drink: less than 200 mg/dL or less than 11.1 mmol/L.  2 hours after consuming the glucose drink: less than 140 mg/dL or less than 7.8 mmol/L.  3 hours after consuming the glucose drink: 70-115 mg/dL or less than 6.4 mmol/L.  4 hours after consuming the glucose drink: 70-115 mg/dL or less than 6.4 mmol/L.  The normal result for the urine test is negative, meaning that glucose is absent from your urine. Some substances can interfere with GTT results. These may include:  Blood pressure and heart failure medicines, including beta blockers, furosemide, and thiazides.  Anti-inflammatory medicines, including aspirin.  Nicotine.  Some psychiatric medicines.  Oral contraceptives.  Diuretics or corticosteroids.  Meaning of Results Outside Normal Value Ranges GTT test results that are above normal values may indicate health problems, such as:  Diabetes mellitus.  Acute stress response.  Cushing syndrome.  Tumors such as pheochromocytoma or glucagonoma.  Chronic renal failure.  Pancreatitis.  Hyperthyroidism.  Current infection.  Discuss your test results with your health care provider. He or she will use the results to make a diagnosis and determine a treatment plan   that is right for you. Talk with your health care provider to discuss your results, treatment options, and if necessary, the need for more tests. Talk with your health care provider if you have any questions about your results. This information is not intended to replace advice given to you by your health care provider. Make sure you discuss any questions you have with your  health care provider. Document Released: 01/25/2004 Document Revised: 09/06/2015 Document Reviewed: 05/08/2013 Elsevier Interactive Patient Education  2017 Elsevier Inc.  

## 2016-04-16 NOTE — Progress Notes (Signed)
Pt declines tdap as of right now. She wants to wait until next visit.

## 2016-04-16 NOTE — Progress Notes (Signed)
   PRENATAL VISIT NOTE  Subjective:  Isabella Landry is a 21 y.o. [phone] at [redacted]w[redacted]d being seen today for ongoing prenatal care.  She is currently monitored for the following issues for this low-risk pregnancy and has Supervision of low-risk first pregnancy on her problem list.  Patient reports no complaints.  Contractions: Not present. Vag. Bleeding: None.  Movement: Present. Denies leaking of fluid.   The following portions of the patient's history were reviewed and updated as appropriate: allergies, current medications, past family history, past medical history, past social history, past surgical history and problem list. Problem list updated.  Objective:   Vitals:   04/16/16 0833  BP: 110/67  Pulse: 95  Weight: 123 lb (55.8 kg)    Fetal Status: Fetal Heart Rate (bpm): 133   Movement: Present     General:  Alert, oriented and cooperative. Patient is in no acute distress.  Skin: Skin is warm and dry. No rash noted.   Cardiovascular: Normal heart rate noted  Respiratory: Normal respiratory effort, no problems with respiration noted  Abdomen: Soft, gravid, appropriate for gestational age. Pain/Pressure: Present     Pelvic:  Cervical exam deferred        Extremities: Normal range of motion.  Edema: None  Mental Status: Normal mood and affect. Normal behavior. Normal judgment and thought content.   Assessment and Plan:  Pregnancy: G4P1021 at [redacted]w[redacted]d  1. Encounter for supervision of other normal pregnancy, third trimester    2. Encounter for supervision of low-risk first pregnancy in second trimester      Followup US scheduled   Preterm labor symptoms and general obstetric precautions including but not limited to vaginal bleeding, contractions, leaking of fluid and fetal movement were reviewed in detail with the patient. Please refer to After Visit Summary for other counseling recommendations.  Return in about 2 weeks (around 04/30/2016) for [employer].   Aviva Signs, CNM

## 2016-04-17 ENCOUNTER — Telehealth: Payer: Self-pay

## 2016-04-17 LAB — HIV ANTIBODY (ROUTINE TESTING W REFLEX): HIV: NONREACTIVE

## 2016-04-17 LAB — RPR

## 2016-04-17 LAB — GLUCOSE TOLERANCE, 2 HOURS W/ 1HR
Glucose, 1 hour: 161 mg/dL
Glucose, 2 hour: 134 mg/dL (ref <140)
Glucose, Fasting: 77 mg/dL (ref 65–99)

## 2016-04-17 NOTE — Telephone Encounter (Signed)
Spoke with pt and she is aware that she passed her 2 hour GTT 

## 2016-04-18 ENCOUNTER — Encounter (HOSPITAL_COMMUNITY): Payer: Self-pay | Admitting: Advanced Practice Midwife

## 2016-04-23 ENCOUNTER — Ambulatory Visit (HOSPITAL_COMMUNITY)
Admission: RE | Admit: 2016-04-23 | Discharge: 2016-04-23 | Disposition: A | Discharge status: Home / Self Care | Payer: BLUE CROSS/BLUE SHIELD | Source: Ambulatory Visit | Attending: Obstetrics & Gynecology | Admitting: Obstetrics & Gynecology

## 2016-04-23 DIAGNOSIS — Z3689 Encounter for other specified antenatal screening: Secondary | ICD-10-CM | POA: Insufficient documentation

## 2016-04-23 DIAGNOSIS — Z3A30 30 weeks gestation of pregnancy: Secondary | ICD-10-CM | POA: Insufficient documentation

## 2016-04-30 ENCOUNTER — Encounter: Payer: BLUE CROSS/BLUE SHIELD | Admitting: Advanced Practice Midwife

## 2016-06-07 ENCOUNTER — Telehealth: Payer: Self-pay | Admitting: *Deleted

## 2016-06-07 NOTE — Telephone Encounter (Signed)
Pt called and left message stating that she was having pelvic cramping but no bleeding or other symptoms.  She has not been seen since 04/16/16.  She does have appt tomorrow.  LM that I was returning her call to us.

## 2016-06-08 ENCOUNTER — Ambulatory Visit (INDEPENDENT_AMBULATORY_CARE_PROVIDER_SITE_OTHER): Payer: BLUE CROSS/BLUE SHIELD | Admitting: Family

## 2016-06-08 VITALS — BP 109/73 | HR 94 | Wt 130.0 lb

## 2016-06-08 DIAGNOSIS — Z3483 Encounter for supervision of other normal pregnancy, third trimester: Secondary | ICD-10-CM

## 2016-06-08 DIAGNOSIS — O98813 Other maternal infectious and parasitic diseases complicating pregnancy, third trimester: Secondary | ICD-10-CM

## 2016-06-08 DIAGNOSIS — O98819 Other maternal infectious and parasitic diseases complicating pregnancy, unspecified trimester: Secondary | ICD-10-CM | POA: Diagnosis not present

## 2016-06-08 DIAGNOSIS — A749 Chlamydial infection, unspecified: Secondary | ICD-10-CM

## 2016-06-08 DIAGNOSIS — Z3A36 36 weeks gestation of pregnancy: Secondary | ICD-10-CM

## 2016-06-08 DIAGNOSIS — Z3403 Encounter for supervision of normal first pregnancy, third trimester: Secondary | ICD-10-CM

## 2016-06-08 DIAGNOSIS — O98219 Gonorrhea complicating pregnancy, unspecified trimester: Secondary | ICD-10-CM

## 2016-06-08 DIAGNOSIS — O98213 Gonorrhea complicating pregnancy, third trimester: Secondary | ICD-10-CM

## 2016-06-08 LAB — OB RESULTS CONSOLE GBS: STREP GROUP B AG: NEGATIVE

## 2016-06-08 MED ORDER — AZITHROMYCIN 500 MG PO TABS
1000.0000 mg | ORAL_TABLET | Freq: Once | ORAL | 1 refills | Status: AC
Start: 2016-06-08 — End: 2016-06-08

## 2016-06-08 NOTE — Patient Instructions (Signed)
Third Trimester of Pregnancy The third trimester is from week 28 through week 40 (months 7 through 9). The third trimester is a time when the unborn baby (fetus) is growing rapidly. At the end of the ninth month, the fetus is about 20 inches in length and weighs 6-10 pounds. Body changes during your third trimester Your body will continue to go through many changes during pregnancy. The changes vary from woman to woman. During the third trimester:  Your weight will continue to increase. You can expect to gain 25-35 pounds (11-16 kg) by the end of the pregnancy.  You may begin to get stretch marks on your hips, abdomen, and breasts.  You may urinate more often because the fetus is moving lower into your pelvis and pressing on your bladder.  You may develop or continue to have heartburn. This is caused by increased hormones that slow down muscles in the digestive tract.  You may develop or continue to have constipation because increased hormones slow digestion and cause the muscles that push waste through your intestines to relax.  You may develop hemorrhoids. These are swollen veins (varicose veins) in the rectum that can itch or be painful.  You may develop swollen, bulging veins (varicose veins) in your legs.  You may have increased body aches in the pelvis, back, or thighs. This is due to weight gain and increased hormones that are relaxing your joints.  You may have changes in your hair. These can include thickening of your hair, rapid growth, and changes in texture. Some women also have hair loss during or after pregnancy, or hair that feels dry or thin. Your hair will most likely return to normal after your baby is born.  Your breasts will continue to grow and they will continue to become tender. A yellow fluid (colostrum) may leak from your breasts. This is the first milk you are producing for your baby.  Your belly button may stick out.  You may notice more swelling in your hands,  face, or ankles.  You may have increased tingling or numbness in your hands, arms, and legs. The skin on your belly may also feel numb.  You may feel short of breath because of your expanding uterus.  You may have more problems sleeping. This can be caused by the size of your belly, increased need to urinate, and an increase in your body's metabolism.  You may notice the fetus "dropping," or moving lower in your abdomen (lightening).  You may have increased vaginal discharge.  You may notice your joints feel loose and you may have pain around your pelvic bone.  What to expect at prenatal visits You will have prenatal exams every 2 weeks until week 36. Then you will have weekly prenatal exams. During a routine prenatal visit:  You will be weighed to make sure you and the baby are growing normally.  Your blood pressure will be taken.  Your abdomen will be measured to track your baby's growth.  The fetal heartbeat will be listened to.  Any test results from the previous visit will be discussed.  You may have a cervical check near your due date to see if your cervix has softened or thinned (effaced).  You will be tested for Group B streptococcus. This happens between 35 and 37 weeks.  Your health care provider may ask you:  What your birth plan is.  How you are feeling.  If you are feeling the baby move.  If you have had   any abnormal symptoms, such as leaking fluid, bleeding, severe headaches, or abdominal cramping.  If you are using any tobacco products, including cigarettes, chewing tobacco, and electronic cigarettes.  If you have any questions.  Other tests or screenings that may be performed during your third trimester include:  Blood tests that check for low iron levels (anemia).  Fetal testing to check the health, activity level, and growth of the fetus. Testing is done if you have certain medical conditions or if there are problems during the  pregnancy.  Nonstress test (NST). This test checks the health of your baby to make sure there are no signs of problems, such as the baby not getting enough oxygen. During this test, a belt is placed around your belly. The baby is made to move, and its heart rate is monitored during movement.  What is false labor? False labor is a condition in which you feel small, irregular tightenings of the muscles in the womb (contractions) that usually go away with rest, changing position, or drinking water. These are called Braxton Hicks contractions. Contractions may last for hours, days, or even weeks before true labor sets in. If contractions come at regular intervals, become more frequent, increase in intensity, or become painful, you should see your health care provider. What are the signs of labor?  Abdominal cramps.  Regular contractions that start at 10 minutes apart and become stronger and more frequent with time.  Contractions that start on the top of the uterus and spread down to the lower abdomen and back.  Increased pelvic pressure and dull back pain.  A watery or bloody mucus discharge that comes from the vagina.  Leaking of amniotic fluid. This is also known as your "water breaking." It could be a slow trickle or a gush. Let your health care provider know if it has a color or strange odor. If you have any of these signs, call your health care provider right away, even if it is before your due date. Follow these instructions at home: Medicines  Follow your health care provider's instructions regarding medicine use. Specific medicines may be either safe or unsafe to take during pregnancy.  Take a prenatal vitamin that contains at least 600 micrograms (mcg) of folic acid.  If you develop constipation, try taking a stool softener if your health care provider approves. Eating and drinking  Eat a balanced diet that includes fresh fruits and vegetables, whole grains, good sources of protein  such as meat, eggs, or tofu, and low-fat dairy. Your health care provider will help you determine the amount of weight gain that is right for you.  Avoid raw meat and uncooked cheese. These carry germs that can cause birth defects in the baby.  If you have low calcium intake from food, talk to your health care provider about whether you should take a daily calcium supplement.  Eat four or five small meals rather than three large meals a day.  Limit foods that are high in fat and processed sugars, such as fried and sweet foods.  To prevent constipation: ? Drink enough fluid to keep your urine clear or pale yellow. ? Eat foods that are high in fiber, such as fresh fruits and vegetables, whole grains, and beans. Activity  Exercise only as directed by your health care provider. Most women can continue their usual exercise routine during pregnancy. Try to exercise for 30 minutes at least 5 days a week. Stop exercising if you experience uterine contractions.  Avoid heavy   lifting.  Do not exercise in extreme heat or humidity, or at high altitudes.  Wear low-heel, comfortable shoes.  Practice good posture.  You may continue to have sex unless your health care provider tells you otherwise. Relieving pain and discomfort  Take frequent breaks and rest with your legs elevated if you have leg cramps or low back pain.  Take warm sitz baths to soothe any pain or discomfort caused by hemorrhoids. Use hemorrhoid cream if your health care provider approves.  Wear a good support bra to prevent discomfort from breast tenderness.  If you develop varicose veins: ? Wear support pantyhose or compression stockings as told by your healthcare provider. ? Elevate your feet for 15 minutes, 3-4 times a day. Prenatal care  Write down your questions. Take them to your prenatal visits.  Keep all your prenatal visits as told by your health care provider. This is important. Safety  Wear your seat belt at  all times when driving.  Make a list of emergency phone numbers, including numbers for family, friends, the hospital, and police and fire departments. General instructions  Avoid cat litter boxes and soil used by cats. These carry germs that can cause birth defects in the baby. If you have a cat, ask someone to clean the litter box for you.  Do not travel far distances unless it is absolutely necessary and only with the approval of your health care provider.  Do not use hot tubs, steam rooms, or saunas.  Do not drink alcohol.  Do not use any products that contain nicotine or tobacco, such as cigarettes and e-cigarettes. If you need help quitting, ask your health care provider.  Do not use any medicinal herbs or unprescribed drugs. These chemicals affect the formation and growth of the baby.  Do not douche or use tampons or scented sanitary pads.  Do not cross your legs for long periods of time.  To prepare for the arrival of your baby: ? Take prenatal classes to understand, practice, and ask questions about labor and delivery. ? Make a trial run to the hospital. ? Visit the hospital and tour the maternity area. ? Arrange for maternity or paternity leave through employers. ? Arrange for family and friends to take care of pets while you are in the hospital. ? Purchase a rear-facing car seat and make sure you know how to install it in your car. ? Pack your hospital bag. ? Prepare the baby's nursery. Make sure to remove all pillows and stuffed animals from the baby's crib to prevent suffocation.  Visit your dentist if you have not gone during your pregnancy. Use a soft toothbrush to brush your teeth and be gentle when you floss. Contact a health care provider if:  You are unsure if you are in labor or if your water has broken.  You become dizzy.  You have mild pelvic cramps, pelvic pressure, or nagging pain in your abdominal area.  You have lower back pain.  You have persistent  nausea, vomiting, or diarrhea.  You have an unusual or bad smelling vaginal discharge.  You have pain when you urinate. Get help right away if:  Your water breaks before 37 weeks.  You have regular contractions less than 5 minutes apart before 37 weeks.  You have a fever.  You are leaking fluid from your vagina.  You have spotting or bleeding from your vagina.  You have severe abdominal pain or cramping.  You have rapid weight loss or weight gain.    You have shortness of breath with chest pain.  You notice sudden or extreme swelling of your face, hands, ankles, feet, or legs.  Your baby makes fewer than 10 movements in 2 hours.  You have severe headaches that do not go away when you take medicine.  You have vision changes. Summary  The third trimester is from week 28 through week 40, months 7 through 9. The third trimester is a time when the unborn baby (fetus) is growing rapidly.  During the third trimester, your discomfort may increase as you and your baby continue to gain weight. You may have abdominal, leg, and back pain, sleeping problems, and an increased need to urinate.  During the third trimester your breasts will keep growing and they will continue to become tender. A yellow fluid (colostrum) may leak from your breasts. This is the first milk you are producing for your baby.  False labor is a condition in which you feel small, irregular tightenings of the muscles in the womb (contractions) that eventually go away. These are called Braxton Hicks contractions. Contractions may last for hours, days, or even weeks before true labor sets in.  Signs of labor can include: abdominal cramps; regular contractions that start at 10 minutes apart and become stronger and more frequent with time; watery or bloody mucus discharge that comes from the vagina; increased pelvic pressure and dull back pain; and leaking of amniotic fluid. This information is not intended to replace advice  given to you by your health care provider. Make sure you discuss any questions you have with your health care provider. Document Released: 12/26/2000 Document Revised: 06/09/2015 Document Reviewed: 03/04/2012 Elsevier Interactive Patient Education  2017 [employer]  

## 2016-06-08 NOTE — Progress Notes (Signed)
   PRENATAL VISIT NOTE  Subjective:  Isabella Landry is a 21 y.o. [phone] at 651w4d being seen today for ongoing prenatal care.  She is currently monitored for the following issues for this low-risk pregnancy and has Supervision of low-risk first pregnancy and Chlamydia infection during pregnancy on her problem list.  Patient reports occasional lower cramping.  Contractions: Irregular. Vag. Bleeding: None.  Movement: Present. Denies leaking of fluid.   The following portions of the patient's history were reviewed and updated as appropriate: allergies, current medications, past family history, past medical history, past social history, past surgical history and problem list. Problem list updated.  Objective:   Vitals:   06/08/16 0853  BP: 109/73  Pulse: 94  Weight: 130 lb (59 kg)    Fetal Status: Fetal Heart Rate (bpm): 132 Fundal Height: 36 cm Movement: Present     General:  Alert, oriented and cooperative. Patient is in no acute distress.  Skin: Skin is warm and dry. No rash noted.   Cardiovascular: Normal heart rate noted  Respiratory: Normal respiratory effort, no problems with respiration noted  Abdomen: Soft, gravid, appropriate for gestational age. Pain/Pressure: Present     Pelvic:  Cervical exam performed        Extremities: Normal range of motion.  Edema: None  Mental Status: Normal mood and affect. Normal behavior. Normal judgment and thought content.   Assessment and Plan:  Pregnancy: G4P1021 at 5051w4d  1. [redacted] weeks gestation of pregnancy - Culture, beta strep (group b only) - [facility] - Follow-up in one week  2. Chlamydia infection during pregnancy - RX Zithromax 1 gram - Plans to return for a test of cure  Preterm labor symptoms and general obstetric precautions including but not limited to vaginal bleeding, contractions, leaking of fluid and fetal movement were reviewed in detail with the patient. Please refer to After  Visit Summary for other counseling recommendations.  Return in 1 week (on 06/15/2016).   Rochele PagesWalidah Karim, CNM

## 2016-06-11 LAB — CULTURE, BETA STREP (GROUP B ONLY)

## 2016-06-12 DIAGNOSIS — O98219 Gonorrhea complicating pregnancy, unspecified trimester: Secondary | ICD-10-CM | POA: Insufficient documentation

## 2016-06-12 LAB — GC/CHLAMYDIA PROBE AMP (~~LOC~~) NOT AT ARMC
Chlamydia: POSITIVE — AB
Neisseria Gonorrhea: POSITIVE — AB

## 2016-06-13 ENCOUNTER — Other Ambulatory Visit (INDEPENDENT_AMBULATORY_CARE_PROVIDER_SITE_OTHER): Payer: BLUE CROSS/BLUE SHIELD | Admitting: *Deleted

## 2016-06-13 ENCOUNTER — Encounter: Payer: Self-pay | Admitting: *Deleted

## 2016-06-13 DIAGNOSIS — A549 Gonococcal infection, unspecified: Secondary | ICD-10-CM

## 2016-06-13 MED ORDER — CEFTRIAXONE SODIUM 250 MG IJ SOLR
250.0000 mg | Freq: Once | INTRAMUSCULAR | Status: AC
  Administered 2016-06-13: 250 mg via INTRAMUSCULAR

## 2016-06-13 NOTE — Progress Notes (Signed)
Pt here for injection of Rocephin 250 mg for positive GC.  She was also given a RX for Azithromycin 1 GM on 06/08/16 by Margarita MailW Karim, CNM for positive Chlamydia.  Pt observed for 10 minutes post inject.  She denies any issues.  Will do TOC in 10 days -2 weeks.

## 2016-06-18 ENCOUNTER — Encounter: Payer: Self-pay | Admitting: Obstetrics & Gynecology

## 2016-06-18 ENCOUNTER — Ambulatory Visit (INDEPENDENT_AMBULATORY_CARE_PROVIDER_SITE_OTHER): Payer: BLUE CROSS/BLUE SHIELD | Admitting: Obstetrics & Gynecology

## 2016-06-18 VITALS — BP 97/66 | HR 101 | Wt 133.0 lb

## 2016-06-18 DIAGNOSIS — Z3483 Encounter for supervision of other normal pregnancy, third trimester: Secondary | ICD-10-CM

## 2016-06-18 DIAGNOSIS — Z3403 Encounter for supervision of normal first pregnancy, third trimester: Secondary | ICD-10-CM

## 2016-06-18 NOTE — Progress Notes (Signed)
   PRENATAL VISIT NOTE  Subjective:  Isabella Landry is a 21 y.o. Kristine RoyalS W  [id number] at 7055w0d being seen today for ongoing prenatal care.  She is currently monitored for the following issues for this low-risk pregnancy and has Supervision of low-risk first pregnancy; Chlamydia infection during pregnancy; Gonorrhea in pregnancy, antepartum; and Gonorrhea on her problem list.  Patient reports no complaints.  Contractions: Irregular. Vag. Bleeding: None.  Movement: Present. Denies leaking of fluid.   The following portions of the patient's history were reviewed and updated as appropriate: allergies, current medications, past family history, past medical history, past social history, past surgical history and problem list. Problem list updated.  Objective:   Vitals:   06/18/16 1252  BP: 97/66  Pulse: (!) 101  Weight: 133 lb (60.3 kg)    Fetal Status: Fetal Heart Rate (bpm): 141   Movement: Present     General:  Alert, oriented and cooperative. Patient is in no acute distress.  Skin: Skin is warm and dry. No rash noted.   Cardiovascular: Normal heart rate noted  Respiratory: Normal respiratory effort, no problems with respiration noted  Abdomen: Soft, gravid, appropriate for gestational age. Pain/Pressure: Present     Pelvic:  Cervical exam deferred        Extremities: Normal range of motion.  Edema: Trace  Mental Status: Normal mood and affect. Normal behavior. Normal judgment and thought content.   Assessment and Plan:  Pregnancy: G4P1021 at 7555w0d  1. Encounter for supervision of low-risk first pregnancy in third trimester TOC for CT/GC at next visit  Term labor symptoms and general obstetric precautions including but not limited to vaginal bleeding, contractions, leaking of fluid and fetal movement were reviewed in detail with the patient. Please refer to After Visit Summary for other counseling recommendations.  No Follow-up on file.   Allie BossierMyra C Yer Olivencia, MD

## 2016-06-18 NOTE — Progress Notes (Signed)
Will do TOC for GC/Chlmydia @ next visit

## 2016-06-26 ENCOUNTER — Ambulatory Visit (INDEPENDENT_AMBULATORY_CARE_PROVIDER_SITE_OTHER): Payer: BLUE CROSS/BLUE SHIELD | Admitting: Obstetrics & Gynecology

## 2016-06-26 VITALS — BP 107/63 | HR 102 | Wt 137.0 lb

## 2016-06-26 DIAGNOSIS — Z3483 Encounter for supervision of other normal pregnancy, third trimester: Secondary | ICD-10-CM

## 2016-06-26 DIAGNOSIS — O98219 Gonorrhea complicating pregnancy, unspecified trimester: Secondary | ICD-10-CM

## 2016-06-26 DIAGNOSIS — Z113 Encounter for screening for infections with a predominantly sexual mode of transmission: Secondary | ICD-10-CM | POA: Diagnosis not present

## 2016-06-26 DIAGNOSIS — A749 Chlamydial infection, unspecified: Secondary | ICD-10-CM

## 2016-06-26 DIAGNOSIS — O98819 Other maternal infectious and parasitic diseases complicating pregnancy, unspecified trimester: Secondary | ICD-10-CM

## 2016-06-26 DIAGNOSIS — O98213 Gonorrhea complicating pregnancy, third trimester: Secondary | ICD-10-CM

## 2016-06-26 DIAGNOSIS — O98813 Other maternal infectious and parasitic diseases complicating pregnancy, third trimester: Secondary | ICD-10-CM

## 2016-06-26 DIAGNOSIS — Z3403 Encounter for supervision of normal first pregnancy, third trimester: Secondary | ICD-10-CM

## 2016-06-26 NOTE — Progress Notes (Signed)
Would like membranes swept    PRENATAL VISIT NOTE  Subjective:  Isabella Landry is a 21 y.o. [phone] at 6873w1d being seen today for ongoing prenatal care.  She is currently monitored for the following issues for this low-risk pregnancy and has Supervision of low-risk first pregnancy; Chlamydia infection during pregnancy; Gonorrhea in pregnancy, antepartum; and Gonorrhea on her problem list.  Patient reports no complaints.  Contractions: Irritability. Vag. Bleeding: None.  Movement: Present. Denies leaking of fluid.   The following portions of the patient's history were reviewed and updated as appropriate: allergies, current medications, past family history, past medical history, past social history, past surgical history and problem list. Problem list updated.  Objective:   Vitals:   06/26/16 1501  BP: 107/63  Pulse: (!) 102  Weight: 137 lb (62.1 kg)    Fetal Status:     Movement: Present     General:  Alert, oriented and cooperative. Patient is in no acute distress.  Skin: Skin is warm and dry. No rash noted.   Cardiovascular: Normal heart rate noted  Respiratory: Normal respiratory effort, no problems with respiration noted  Abdomen: Soft, gravid, appropriate for gestational age. Pain/Pressure: Present     Pelvic:  Cervical exam performed      1.5/50/-3  Extremities: Normal range of motion.  Edema: Trace  Mental Status: Normal mood and affect. Normal behavior. Normal judgment and thought content.   Assessment and Plan:  Pregnancy: G4P1021 at 4673w1d  1. Encounter for supervision of low-risk first pregnancy in third trimester Membranes stripped  2. Chlamydia infection during pregnancy TOC today  3. Gonorrhea in pregnancy, antepartum TOC today  Term labor symptoms and general obstetric precautions including but not limited to vaginal bleeding, contractions, leaking of fluid and fetal movement were reviewed in detail with the patient. Please refer to After Visit Summary  for other counseling recommendations.  Return in about 1 year (around 06/26/2017).   Elsie LincolnKelly Leggett, MD

## 2016-06-29 LAB — CERVICOVAGINAL ANCILLARY ONLY
CHLAMYDIA, DNA PROBE: NEGATIVE
Neisseria Gonorrhea: NEGATIVE

## 2016-07-02 ENCOUNTER — Encounter: Payer: Self-pay | Admitting: *Deleted

## 2016-07-02 ENCOUNTER — Ambulatory Visit (INDEPENDENT_AMBULATORY_CARE_PROVIDER_SITE_OTHER): Payer: BLUE CROSS/BLUE SHIELD | Admitting: Obstetrics & Gynecology

## 2016-07-02 VITALS — BP 115/77 | HR 96 | Wt 140.0 lb

## 2016-07-02 DIAGNOSIS — Z3403 Encounter for supervision of normal first pregnancy, third trimester: Secondary | ICD-10-CM

## 2016-07-02 DIAGNOSIS — O98219 Gonorrhea complicating pregnancy, unspecified trimester: Secondary | ICD-10-CM | POA: Diagnosis not present

## 2016-07-02 DIAGNOSIS — A749 Chlamydial infection, unspecified: Secondary | ICD-10-CM

## 2016-07-02 DIAGNOSIS — O98819 Other maternal infectious and parasitic diseases complicating pregnancy, unspecified trimester: Secondary | ICD-10-CM

## 2016-07-02 NOTE — Patient Instructions (Signed)
Augmentation of Labor Augmentation of labor is when steps are taken to stimulate and strengthen uterine contractions during labor. This may be done when the contractions have slowed down or stopped, delaying progress of labor and delivery of the baby. Before beginning augmentation of labor, the health care provider will evaluate the condition of the mother and baby, the size and position of the baby, and the size of the birth canal. What are the reasons for labor augmentation? Reasons for augmentation of labor include:  Slow labor (prolonged first and second stage of labor) that has been associated with increased maternal risks, such as chorioamnionitis, postpartum hemorrhage, operative vaginal delivery, or third-degree or fourth-degree perineal lacerations.  Decreased average length of labor.  What methods are used for labor augmentation? Various methods may be used for augmentation of labor, including:  Oxytocin medicine. This medicine stimulates contractions. It is given through an IV access tube inserted into a vein.  Breaking the fluid-filled sac that surrounds the fetus (amniotic sac).  Stripping the membranes. The health care provider separates amniotic sac tissue from the cervix, causing the release of a hormone called progesterone that can stimulate uterine contractions.  Nipple stimulation.  Stimulation of certain pressure points on the ankles.  Manual or mechanical dilation of the cervix.  What are the risks associated with labor augmentation?  Overstimulation of the uterine contractions (continuous, prolonged, very strong contractions), causing fetal distress.  Increased chance of infection for the mother and baby.  Uterine tearing (rupture).  Breaking off (abruption) of the placenta.  Increased chance of cesarean, forceps, or vacuum delivery. What are some reasons for not doing labor augmentation? Augmentation of labor should not be done if:  The baby is too big for  the birth canal. This can be confirmed by ultrasonography.  The umbilical cord drops in front of the baby's head or breech part (prolapsed cord).  The mother had a previous cesarean delivery with a vertical incision in the uterus (or the kind of incision used is not known). High dose oxytocin should not be used if the mother had a previous cesarean delivery of any kind.  The mother had previous surgery on or into the uterus.  The mother has herpes.  The mother has cervical cancer.  The baby is lying sideways.  The mother's pelvis is deformed.  The mother is pregnant with more than two babies.  This information is not intended to replace advice given to you by your health care provider. Make sure you discuss any questions you have with your health care provider. Document Released: 06/26/2006 Document Revised: 06/15/2015 Document Reviewed: 07/31/2012 Elsevier Interactive Patient Education  2017 [employer] Inc.  

## 2016-07-02 NOTE — Progress Notes (Signed)
   PRENATAL VISIT NOTE  Subjective:  Isabella Landry is a 21 y.o. [phone] at 6727w0d being seen today for ongoing prenatal care.  She is currently monitored for the following issues for this high-risk pregnancy and has Supervision of low-risk first pregnancy; Chlamydia infection during pregnancy; Gonorrhea in pregnancy, antepartum; and Gonorrhea on her problem list.  Patient reports occasional contractions.  Contractions: Irritability. Vag. Bleeding: None.  Movement: Present. Denies leaking of fluid.   The following portions of the patient's history were reviewed and updated as appropriate: allergies, current medications, past family history, past medical history, past social history, past surgical history and problem list. Problem list updated.  Objective:   Vitals:   07/02/16 1452  BP: 115/77  Pulse: 96  Weight: 140 lb (63.5 kg)    Fetal Status: Fetal Heart Rate (bpm): NST   Movement: Present     General:  Alert, oriented and cooperative. Patient is in no acute distress.  Skin: Skin is warm and dry. No rash noted.   Cardiovascular: Normal heart rate noted  Respiratory: Normal respiratory effort, no problems with respiration noted  Abdomen: Soft, gravid, appropriate for gestational age. Pain/Pressure: Present     Pelvic:  Cervical exam deferred        Extremities: Normal range of motion.  Edema: Trace  Mental Status: Normal mood and affect. Normal behavior. Normal judgment and thought content.   Assessment and Plan:  Pregnancy: G4P1021 at 2227w0d  1. Encounter for supervision of low-risk first pregnancy in third trimester  2. Chlamydia infection during pregnancy Pt not greater than 1 mo of tx rec TOC at hosp  3. Gonorrhea in pregnancy, antepartum Pt not greater than 1 mo of tx rec TOC at hosp  4. Prolonged pregnancy NST reviewed and reactive today f/u NST in 3days. F/u 1 week for IOL if undelivered  Term labor symptoms and general obstetric precautions including but  not limited to vaginal bleeding, contractions, leaking of fluid and fetal movement were reviewed in detail with the patient. Please refer to After Visit Summary for other counseling recommendations.    Willodean Rosenthalarolyn Harraway-Smith, MD

## 2016-07-03 ENCOUNTER — Telehealth (HOSPITAL_COMMUNITY): Payer: Self-pay | Admitting: *Deleted

## 2016-07-03 NOTE — Telephone Encounter (Signed)
Preadmission screen  

## 2016-07-05 ENCOUNTER — Ambulatory Visit (INDEPENDENT_AMBULATORY_CARE_PROVIDER_SITE_OTHER): Payer: BLUE CROSS/BLUE SHIELD | Admitting: *Deleted

## 2016-07-05 VITALS — BP 110/73 | HR 112 | Wt 140.0 lb

## 2016-07-05 DIAGNOSIS — O9989 Other specified diseases and conditions complicating pregnancy, childbirth and the puerperium: Secondary | ICD-10-CM

## 2016-07-05 DIAGNOSIS — O480 Post-term pregnancy: Secondary | ICD-10-CM

## 2016-07-06 ENCOUNTER — Other Ambulatory Visit: Payer: Self-pay | Admitting: Advanced Practice Midwife

## 2016-07-07 ENCOUNTER — Inpatient Hospital Stay (HOSPITAL_COMMUNITY): Payer: BLUE CROSS/BLUE SHIELD | Admitting: Anesthesiology

## 2016-07-07 ENCOUNTER — Inpatient Hospital Stay (HOSPITAL_COMMUNITY)
Admission: AD | Admit: 2016-07-07 | Discharge: 2016-07-09 | DRG: 775 | Disposition: A | Discharge status: Home / Self Care | Payer: BLUE CROSS/BLUE SHIELD | Source: Ambulatory Visit | Attending: Obstetrics and Gynecology | Admitting: Obstetrics and Gynecology

## 2016-07-07 ENCOUNTER — Encounter (HOSPITAL_COMMUNITY): Payer: Self-pay | Admitting: *Deleted

## 2016-07-07 DIAGNOSIS — Z87891 Personal history of nicotine dependence: Secondary | ICD-10-CM | POA: Diagnosis not present

## 2016-07-07 DIAGNOSIS — Z3493 Encounter for supervision of normal pregnancy, unspecified, third trimester: Secondary | ICD-10-CM | POA: Diagnosis present

## 2016-07-07 DIAGNOSIS — Z3A40 40 weeks gestation of pregnancy: Secondary | ICD-10-CM

## 2016-07-07 LAB — CBC
HCT: 34 % — ABNORMAL LOW (ref 36.0–46.0)
Hemoglobin: 11.1 g/dL — ABNORMAL LOW (ref 12.0–15.0)
MCH: 28.7 pg (ref 26.0–34.0)
MCHC: 32.6 g/dL (ref 30.0–36.0)
MCV: 87.9 fL (ref 78.0–100.0)
PLATELETS: 235 10*3/uL (ref 150–400)
RBC: 3.87 MIL/uL (ref 3.87–5.11)
RDW: 14.5 % (ref 11.5–15.5)
WBC: 14.3 10*3/uL — ABNORMAL HIGH (ref 4.0–10.5)

## 2016-07-07 LAB — TYPE AND SCREEN
ABO/RH(D): A POS
Antibody Screen: NEGATIVE

## 2016-07-07 LAB — POCT FERN TEST
POCT Fern Test: NEGATIVE
POCT Fern Test: NEGATIVE

## 2016-07-07 MED ORDER — SODIUM CHLORIDE 0.9% FLUSH: 3.0000 mL | Freq: Two times a day (BID) | INTRAVENOUS | Status: DC

## 2016-07-07 MED ORDER — ACETAMINOPHEN 325 MG PO TABS: 650.0000 mg | ORAL_TABLET | ORAL | Status: DC | PRN

## 2016-07-07 MED ORDER — LACTATED RINGERS IV SOLN: 500.0000 mL | Freq: Once | INTRAVENOUS | Status: DC

## 2016-07-07 MED ORDER — ONDANSETRON HCL 4 MG/2ML IJ SOLN: 4.0000 mg | INTRAMUSCULAR | Status: DC | PRN

## 2016-07-07 MED ORDER — EPHEDRINE 5 MG/ML INJ
10.0000 mg | INTRAVENOUS | Status: DC | PRN
  Filled 2016-07-07: qty 2

## 2016-07-07 MED ORDER — FENTANYL CITRATE (PF) 100 MCG/2ML IJ SOLN
100.0000 ug | INTRAMUSCULAR | Status: DC | PRN
Start: 2016-07-07 — End: 2016-07-08

## 2016-07-07 MED ORDER — LIDOCAINE HCL (PF) 1 % IJ SOLN
INTRAMUSCULAR | Status: DC | PRN
  Administered 2016-07-07 (×2): 6 mL via EPIDURAL

## 2016-07-07 MED ORDER — PRENATAL MULTIVITAMIN CH
1.0000 | ORAL_TABLET | Freq: Every day | ORAL | Status: DC
  Administered 2016-07-08: 1 via ORAL
  Filled 2016-07-07: qty 1

## 2016-07-07 MED ORDER — PHENYLEPHRINE 40 MCG/ML (10ML) SYRINGE FOR IV PUSH (FOR BLOOD PRESSURE SUPPORT)
80.0000 ug | PREFILLED_SYRINGE | INTRAVENOUS | Status: DC | PRN
  Filled 2016-07-07: qty 5
  Filled 2016-07-07: qty 10

## 2016-07-07 MED ORDER — OXYTOCIN BOLUS FROM INFUSION
500.0000 mL | Freq: Once | INTRAVENOUS | Status: AC
  Administered 2016-07-07: 500 mL via INTRAVENOUS

## 2016-07-07 MED ORDER — WITCH HAZEL-GLYCERIN EX PADS: 1.0000 "application " | MEDICATED_PAD | CUTANEOUS | Status: DC | PRN

## 2016-07-07 MED ORDER — ZOLPIDEM TARTRATE 5 MG PO TABS: 5.0000 mg | ORAL_TABLET | Freq: Every evening | ORAL | Status: DC | PRN

## 2016-07-07 MED ORDER — LACTATED RINGERS IV SOLN
INTRAVENOUS | Status: DC
  Administered 2016-07-07 (×2): via INTRAVENOUS

## 2016-07-07 MED ORDER — OXYCODONE-ACETAMINOPHEN 5-325 MG PO TABS
1.0000 | ORAL_TABLET | ORAL | Status: DC | PRN
Start: 2016-07-07 — End: 2016-07-08
  Administered 2016-07-07 – 2016-07-08 (×3): 1 via ORAL
  Filled 2016-07-07 (×3): qty 1

## 2016-07-07 MED ORDER — ONDANSETRON HCL 4 MG PO TABS: 4.0000 mg | ORAL_TABLET | ORAL | Status: DC | PRN

## 2016-07-07 MED ORDER — DIPHENHYDRAMINE HCL 25 MG PO CAPS: 25.0000 mg | ORAL_CAPSULE | Freq: Four times a day (QID) | ORAL | Status: DC | PRN

## 2016-07-07 MED ORDER — DIBUCAINE 1 % RE OINT: 1.0000 "application " | TOPICAL_OINTMENT | RECTAL | Status: DC | PRN

## 2016-07-07 MED ORDER — FENTANYL 2.5 MCG/ML BUPIVACAINE 1/10 % EPIDURAL INFUSION (WH - ANES)
14.0000 mL/h | INTRAMUSCULAR | Status: DC | PRN
  Administered 2016-07-07 (×2): 14 mL/h via EPIDURAL
  Filled 2016-07-07 (×2): qty 100

## 2016-07-07 MED ORDER — SODIUM CHLORIDE 0.9 % IV SOLN: 250.0000 mL | INTRAVENOUS | Status: DC | PRN

## 2016-07-07 MED ORDER — LIDOCAINE HCL (PF) 1 % IJ SOLN
30.0000 mL | INTRAMUSCULAR | Status: DC | PRN
  Administered 2016-07-07: 30 mL via SUBCUTANEOUS
  Filled 2016-07-07: qty 30

## 2016-07-07 MED ORDER — SODIUM CHLORIDE 0.9% FLUSH: 3.0000 mL | INTRAVENOUS | Status: DC | PRN

## 2016-07-07 MED ORDER — SIMETHICONE 80 MG PO CHEW
80.0000 mg | CHEWABLE_TABLET | ORAL | Status: DC | PRN
Start: 2016-07-07 — End: 2016-07-09

## 2016-07-07 MED ORDER — ONDANSETRON HCL 4 MG/2ML IJ SOLN
4.0000 mg | Freq: Four times a day (QID) | INTRAMUSCULAR | Status: DC | PRN
  Administered 2016-07-07: 4 mg via INTRAVENOUS
  Filled 2016-07-07: qty 2

## 2016-07-07 MED ORDER — OXYTOCIN 40 UNITS IN LACTATED RINGERS INFUSION - SIMPLE MED: 2.5000 [IU]/h | INTRAVENOUS | Status: DC

## 2016-07-07 MED ORDER — FLEET ENEMA 7-19 GM/118ML RE ENEM: 1.0000 | ENEMA | RECTAL | Status: DC | PRN

## 2016-07-07 MED ORDER — SENNOSIDES-DOCUSATE SODIUM 8.6-50 MG PO TABS
2.0000 | ORAL_TABLET | ORAL | Status: DC
  Administered 2016-07-08 – 2016-07-09 (×2): 2 via ORAL
  Filled 2016-07-07 (×2): qty 2

## 2016-07-07 MED ORDER — SOD CITRATE-CITRIC ACID 500-334 MG/5ML PO SOLN: 30.0000 mL | ORAL | Status: DC | PRN

## 2016-07-07 MED ORDER — DOCUSATE SODIUM 100 MG PO CAPS
100.0000 mg | ORAL_CAPSULE | Freq: Two times a day (BID) | ORAL | Status: DC
  Administered 2016-07-08 – 2016-07-09 (×4): 100 mg via ORAL
  Filled 2016-07-07 (×4): qty 1

## 2016-07-07 MED ORDER — OXYCODONE-ACETAMINOPHEN 5-325 MG PO TABS: 2.0000 | ORAL_TABLET | ORAL | Status: DC | PRN

## 2016-07-07 MED ORDER — BENZOCAINE-MENTHOL 20-0.5 % EX AERO
1.0000 "application " | INHALATION_SPRAY | CUTANEOUS | Status: DC | PRN
  Administered 2016-07-08: 1 via TOPICAL
  Filled 2016-07-07: qty 56

## 2016-07-07 MED ORDER — MISOPROSTOL 25 MCG QUARTER TABLET
25.0000 ug | ORAL_TABLET | ORAL | Status: DC | PRN
  Filled 2016-07-07: qty 1

## 2016-07-07 MED ORDER — TETANUS-DIPHTH-ACELL PERTUSSIS 5-2.5-18.5 LF-MCG/0.5 IM SUSP: 0.5000 mL | Freq: Once | INTRAMUSCULAR | Status: DC

## 2016-07-07 MED ORDER — DIPHENHYDRAMINE HCL 50 MG/ML IJ SOLN: 12.5000 mg | INTRAMUSCULAR | Status: DC | PRN

## 2016-07-07 MED ORDER — COCONUT OIL OIL: 1.0000 "application " | TOPICAL_OIL | Status: DC | PRN

## 2016-07-07 MED ORDER — OXYTOCIN 40 UNITS IN LACTATED RINGERS INFUSION - SIMPLE MED
1.0000 m[IU]/min | INTRAVENOUS | Status: DC
  Administered 2016-07-07: 2 m[IU]/min via INTRAVENOUS
  Filled 2016-07-07: qty 1000

## 2016-07-07 MED ORDER — ACETAMINOPHEN 325 MG PO TABS
650.0000 mg | ORAL_TABLET | ORAL | Status: DC | PRN
Start: 2016-07-07 — End: 2016-07-09

## 2016-07-07 MED ORDER — IBUPROFEN 100 MG/5ML PO SUSP
600.0000 mg | Freq: Three times a day (TID) | ORAL | Status: DC | PRN
  Administered 2016-07-08 – 2016-07-09 (×5): 600 mg via ORAL
  Filled 2016-07-07 (×6): qty 30

## 2016-07-07 MED ORDER — OXYTOCIN 40 UNITS IN LACTATED RINGERS INFUSION - SIMPLE MED: 2.5000 [IU]/h | INTRAVENOUS | Status: DC | PRN

## 2016-07-07 MED ORDER — TERBUTALINE SULFATE 1 MG/ML IJ SOLN
0.2500 mg | Freq: Once | INTRAMUSCULAR | Status: DC | PRN
  Filled 2016-07-07: qty 1

## 2016-07-07 MED ORDER — LACTATED RINGERS IV SOLN: 500.0000 mL | INTRAVENOUS | Status: DC | PRN

## 2016-07-07 MED ORDER — IBUPROFEN 600 MG PO TABS
600.0000 mg | ORAL_TABLET | Freq: Four times a day (QID) | ORAL | Status: DC
  Filled 2016-07-07: qty 1
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
## 2016-07-07 NOTE — Progress Notes (Signed)
Operative Delivery Note At  a viable female was delivered via .  Presentation: vertex; Position: Occiput,, Posterior; Station: +2.  Verbal consent: obtained from patient.  Risks and benefits discussed in detail.  Risks include, but are not limited to the risks of anesthesia, bleeding, infection, damage to maternal tissues, fetal cephalhematoma.  There is also the risk of inability to effect vaginal delivery of the head, or shoulder dystocia that cannot be resolved by established maneuvers, leading to the need for emergency cesarean section.  APGAR: , ; weight  .   Placenta status: , .   Cord:  with the following complications: .  Cord pH: pending  Anesthesia:   Instruments: Luikhart forceps Episiotomy:  partial third degree Lacerations:   Suture Repair: vicryl 2/0 and 3/0  Est. Blood Loss (mL):   350  Mom to postpartum.  Baby to Couplet care / Skin to Skin.    Terminal bradycardia noted, Kiwi vacuum applied, unable to maintain suction d/t aminoitic fluid. Forceps applied with good application. Foley in placed. Working Epidural. + 2 station, vertex. Episiotomy cut. With maternal contractions, infants head delivered without problems, nuchal cord x 1 noted, reduced, ant shoulder and remainder of infant delivered without problems. Cord clamped and cut and infant passed to NICU. Cord blood and cord gas obtained. Intact placenta by gentle traction. 3 vessel cord and intact placenta. Placenta to pathology. Episiotomy repaired with 2/0 and 3/0 Vicryl.  All counts correct. Mother and infant recovering together in room.   [name] 07/07/2016, 9:36 PM  Patient ID: Isabella Landry, female   DOB: 02/21/1995, 21 y.o.   MRN: [id number]

## 2016-07-07 NOTE — Anesthesia Pain Management Evaluation Note (Signed)
  CRNA Pain Management Visit Note  Patient: Isabella Landry, 21 y.o., female  "Hello I am a [facility] of the anesthesia [facility] at [facility]. We have an anesthesia team available at all times to provide care throughout the hospital, including epidural management and anesthesia for C-section. I don't know your plan for the delivery whether it a natural birth, water birth, IV sedation, nitrous supplementation, doula or epidural, but we want to meet your pain goals."   1.Was your pain managed to your expectations on prior hospitalizations?   Yes   2.What is your expectation for pain management during this hospitalization?     Epidural  3.How can we help you reach that goal? Epidural when ready.  Record the patient's initial score and the patient's pain goal.   Pain: 7  Pain Goal: 8 [facility] wants you to be able to say your pain was always managed very well.  Odeth Bry L 07/07/2016
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
## 2016-07-07 NOTE — Anesthesia Procedure Notes (Signed)
Epidural Patient location during procedure: OB Start time: 07/07/2016 1:38 PM End time: 07/07/2016 1:42 PM  Staffing Anesthesiologist: Leilani AbleHATCHETT, Elwanda Moger Performed: anesthesiologist   Preanesthetic Checklist Completed: patient identified, surgical consent, pre-op evaluation, timeout performed, IV checked, risks and benefits discussed and monitors and equipment checked  Epidural Patient position: sitting Prep: site prepped and draped and DuraPrep Patient monitoring: continuous pulse ox and blood pressure Approach: midline Location: L3-L4 Injection technique: LOR air  Needle:  Needle type: Tuohy  Needle gauge: 17 G Needle length: 9 cm and 9 Needle insertion depth: 5 cm cm Catheter type: closed end flexible Catheter size: 19 Gauge Catheter at skin depth: 10 cm Test dose: negative and Other  Assessment Sensory level: T9 Events: blood not aspirated, injection not painful, no injection resistance, negative IV test and no paresthesia  Additional Notes Reason for block:procedure for pain

## 2016-07-07 NOTE — Anesthesia Preprocedure Evaluation (Signed)
Anesthesia Evaluation  Patient identified by MRN, date of birth, ID band Patient awake    Reviewed: Allergy & Precautions, H&P , NPO status , Patient's Chart, lab work & pertinent test results  Airway Mallampati: I  TM Distance: >3 FB Neck ROM: full    Dental no notable dental hx.    Pulmonary neg pulmonary ROS, former smoker,    Pulmonary exam normal breath sounds clear to auscultation       Cardiovascular negative cardio ROS Normal cardiovascular exam Rhythm:regular Rate:Normal     Neuro/Psych negative neurological ROS     GI/Hepatic negative GI ROS, Neg liver ROS,   Endo/Other  negative endocrine ROS  Renal/GU negative Renal ROS     Musculoskeletal negative musculoskeletal ROS (+)   Abdominal Normal abdominal exam  (+)   Peds  Hematology negative hematology ROS (+)   Anesthesia Other Findings   Reproductive/Obstetrics (+) Pregnancy                             Anesthesia Physical Anesthesia Plan  ASA: II  Anesthesia Plan: Epidural   Post-op Pain Management:    Induction:   PONV Risk Score and Plan:   Airway Management Planned:   Additional Equipment:   Intra-op Plan:   Post-operative Plan:   Informed Consent: I have reviewed the patients History and Physical, chart, labs and discussed the procedure including the risks, benefits and alternatives for the proposed anesthesia with the patient or authorized representative who has indicated his/her understanding and acceptance.     Plan Discussed with:   Anesthesia Plan Comments:         Anesthesia Quick Evaluation

## 2016-07-08 LAB — RPR: RPR: NONREACTIVE

## 2016-07-08 MED ORDER — OXYCODONE-ACETAMINOPHEN 5-325 MG PO TABS
1.0000 | ORAL_TABLET | ORAL | Status: DC | PRN
  Administered 2016-07-08 – 2016-07-09 (×5): 1 via ORAL
  Filled 2016-07-08 (×5): qty 1

## 2016-07-08 NOTE — Progress Notes (Signed)
POSTPARTUM PROGRESS NOTE  Post Partum Day #1  forcep-assisted vaginal delivery with 3rd degree laceration  Subjective:  Isabella Landry is a 21 y.o. [id number] 149w5d s/p vaginal delivery, vacuum and forceps-assisted.  No acute events overnight.  Pt denies problems with ambulating, voiding or po intake.  She denies nausea or vomiting.  Pain is moderately controlled.  She has had flatus. She has not had bowel movement.  Lochia Minimal.   Objective: Blood pressure (!) 105/52, pulse 96, temperature 98.7 F (37.1 C), temperature source Oral, resp. rate 16, height 4\' 11"  (1.499 m), weight 63.5 kg (140 lb), last menstrual period 09/26/2015, SpO2 100 %, unknown if currently breastfeeding.  Physical Exam:  General: alert, cooperative and no distress Lochia:normal flow Uterine Fundus: firm, below umbilicus DVT Evaluation: No calf swelling or tenderness Extremities: no LE edema   Recent Labs  07/07/16 1030  HGB 11.1*  HCT 34.0*    Assessment/Plan:  ASSESSMENT: Isabella Landry is a 21 y.o. [id number] 4049w5d s/p FAVD  Plan for discharge tomorrow   LOS: 1 day   Howard PouchLauren Feng, MD PGY-1 [facility]  07/08/2016, 6:54 AM   OB FELLOW POSTPARTUM PROGRESS NOTE ATTESTATION  I confirm that I have verified the information documented in the resident's note and that I have also personally reperformed the physical exam and all medical decision making activities.     Ernestina PennaNicholas Raetta Agostinelli, MD 9:31 AM

## 2016-07-08 NOTE — Anesthesia Postprocedure Evaluation (Signed)
Anesthesia Post Note  Patient: Isabella HailJessica Lynne Landry  Procedure(s) Performed: * No procedures listed *     Patient location during evaluation: Mother Baby Anesthesia Type: Epidural Level of consciousness: awake Pain management: pain level controlled Vital Signs Assessment: post-procedure vital signs reviewed and stable Respiratory status: spontaneous breathing Cardiovascular status: stable Postop Assessment: no headache, no backache, epidural receding, patient able to bend at knees, no signs of nausea or vomiting and adequate PO intake Anesthetic complications: no    Last Vitals:  Vitals:   07/07/16 2251 07/08/16 0014  BP: (!) 114/54 (!) 105/52  Pulse: 100 96  Resp: 19 16  Temp: 36.7 C 37.1 C    Last Pain:  Vitals:   07/08/16 0338  TempSrc:   PainSc: 3    Pain Goal: Patients Stated Pain Goal: 7 (07/07/16 1017)               Saarah Dewing

## 2016-07-08 NOTE — Plan of Care (Signed)
Problem: Pain Managment: Goal: General experience of comfort will improve Outcome: Progressing Patient complaining of increased perineal pain unresolved with ibuprofen. Percocet every 4 prn has been ordered and patient reports moderate pain relief. Given a sitz bath with instructions for comfort and promote healing. Advised to ambulate in the hallway to increase perineal circulation and decrease pressure from sitting in the bed. Offered ice, patient declined stating the cold caused her increase pain.

## 2016-07-09 ENCOUNTER — Inpatient Hospital Stay (HOSPITAL_COMMUNITY): Admission: RE | Admit: 2016-07-09 | Payer: BLUE CROSS/BLUE SHIELD | Source: Ambulatory Visit

## 2016-07-09 MED ORDER — OXYCODONE-ACETAMINOPHEN 5-325 MG PO TABS: 1.0000 | ORAL_TABLET | ORAL | 0 refills | Status: DC | PRN

## 2016-07-09 MED ORDER — POLYETHYLENE GLYCOL 3350 17 G PO PACK: 17.0000 g | PACK | Freq: Every day | ORAL | 0 refills | Status: DC

## 2016-07-09 MED ORDER — IBUPROFEN 100 MG/5ML PO SUSP: 600.0000 mg | Freq: Three times a day (TID) | ORAL | 0 refills | Status: AC | PRN

## 2016-07-09 MED ORDER — DOCUSATE SODIUM 100 MG PO CAPS: 100.0000 mg | ORAL_CAPSULE | Freq: Two times a day (BID) | ORAL | 1 refills | Status: DC

## 2016-07-09 NOTE — Progress Notes (Signed)
POSTPARTUM PROGRESS NOTE  Post Partum Day 2 Subjective:  Isabella Landry is a 21 y.o. [id number] 1089w5d s/p FAVD.  No acute events overnight.  Pt denies problems with ambulating, voiding or po intake.  She denies nausea or vomiting.  Pain is moderately controlled but she would like to be discharged with a prescription for pain because it is still a 7/10 with sitting.  She has not had bowel movement.  Lochia minimal. States her bleeding is no more than her normal period.  Objective: Blood pressure 111/62, pulse 81, temperature 98 F (36.7 C), temperature source Oral, resp. rate 16, height 4\' 11"  (1.499 m), weight 63.5 kg (140 lb), last menstrual period 09/26/2015, SpO2 100 %, unknown if currently breastfeeding.  Physical Exam:  General: alert, cooperative and no distress Lochia:normal flow Chest: CTAB Heart: RRR no m/r/g Abdomen: +BS, soft, nontender,  Uterine Fundus: firm DVT Evaluation: No calf swelling or tenderness Extremities:     Recent Labs  07/07/16 1030  HGB 11.1*  HCT 34.0*    Assessment/Plan:  ASSESSMENT: Isabella Landry is a 21 y.o. [id number] 899w5d s/p PPD2  FAVD.    Discharge home today with oxycodone for pain.  Patient plans to have baby circumcised outpatient. Patient wants to be started on depo injections for birth control.   LOS: 2 days   Chrissie NoaMatthew Wallman, Student-PA   OB FELLOW POSTPARTUM PROGRESS NOTE ATTESTATION  I confirm that I have verified the information documented in the resident's note and that I have also personally reperformed the physical exam and all medical decision making activities.     Ernestina PennaNicholas Schenk, MD 9:31 AM

## 2016-07-09 NOTE — Discharge Summary (Signed)
OB Discharge Summary     Patient Name: Isabella Landry DOB: 1995/07/11 MRN: [id number]  Date of admission: 07/07/2016 Delivering MD: Hermina Staggers   Date of discharge: 07/09/2016  Admitting diagnosis: 40wks leaking back pain ck fm Intrauterine pregnancy: [redacted]w[redacted]d     Secondary diagnosis:  Active Problems:   Normal labor   Perineal laceration with delivery, third degree, delivered  Additional problems: Forceps assisted delivery     Discharge diagnosis: Term Pregnancy Delivered and 3rd deegree perineal lacerations                                                                                                Post partum procedures:none  Augmentation: AROM and Pitocin  Complications: None  Hospital course:  Onset of Labor With Vaginal Delivery     21 y.o. yo [id number] at [redacted]w[redacted]d was admitted in Latent Labor on 07/07/2016. Patient had an uncomplicated labor course as follows:  Membrane Rupture Time/Date: 6:17 PM ,07/07/2016   Intrapartum Procedures: Episiotomy: Median [2]                                         Lacerations:  3rd degree [4]  Patient had a delivery of a Viable infant. 07/07/2016  Information for the patient's newborn:  Rasheeda, [place] [[id number]]  Delivery Method: Vag-Forceps    Pateint had an uncomplicated postpartum course.  She is ambulating, tolerating a regular diet, passing flatus, and urinating well. Patient is discharged home in stable condition on 07/09/16.   Physical exam  Vitals:   07/08/16 0014 07/08/16 0917 07/08/16 1800 07/09/16 0500  BP: (!) 105/52 (!) 95/57 117/67 111/62  Pulse: 96 84 97 81  Resp: 16 16 18 16   Temp: 98.7 F (37.1 C) 98.1 F (36.7 C) 98.3 F (36.8 C) 98 F (36.7 C)  TempSrc: Oral Oral  Oral  SpO2: 100% 100%    Weight:      Height:       General: alert, cooperative and no distress Lochia: appropriate Perineal exam: well healing skin with mild swelling Uterine Fundus: firm Incision: N/A DVT Evaluation: No evidence of  DVT seen on physical exam. Labs: Lab Results  Component Value Date   WBC 14.3 (H) 07/07/2016   HGB 11.1 (L) 07/07/2016   HCT 34.0 (L) 07/07/2016   MCV 87.9 07/07/2016   PLT 235 07/07/2016   CMP Latest Ref Rng & Units 02/14/2016  Glucose 65 - 99 mg/dL 83  BUN 6 - 23 mg/dL -  Creatinine 0.4 - 1.2 mg/dL -  Sodium 130 - 865 mEq/L -  Potassium 3.5 - 5.1 mEq/L -  Chloride 96 - 112 mEq/L -  CO2 19 - 32 mEq/L -  Calcium 8.4 - 10.5 mg/dL -  Total Protein 6.0 - 8.3 g/dL -  Total Bilirubin 0.3 - 1.2 mg/dL -  Alkaline Phos 47 - 784 U/L -  AST 0 - 37 U/L -  ALT 0 - 35 U/L -    Discharge instruction: per  After Visit Summary and "Baby and Me Booklet".  After visit meds:  Allergies as of 07/09/2016   No Known Allergies     Medication List    TAKE these medications   docusate sodium 100 MG capsule Commonly known as:  COLACE Take 1 capsule (100 mg total) by mouth 2 (two) times daily.   ibuprofen 100 MG/5ML suspension Commonly known as:  ADVIL,MOTRIN Take 30 mLs (600 mg total) by mouth every 8 (eight) hours as needed for mild pain.   oxyCODONE-acetaminophen 5-325 MG tablet Commonly known as:  PERCOCET/ROXICET Take 1 tablet by mouth every 4 (four) hours as needed for severe pain.   polyethylene glycol packet Commonly known as:  MIRALAX / GLYCOLAX Take 17 g by mouth daily.       Diet: routine diet  Activity: Advance as tolerated. Pelvic rest for 6 weeks.   Outpatient follow up:1 wk Follow up Appt:No future appointments. Follow up Visit:No Follow-up on file.  Postpartum contraception: Depo Provera  Newborn Data: Live born female  Birth Weight: 7 lb 12.5 oz (3530 g) APGAR: 4, 8  Baby Feeding: Bottle Disposition:home with mother   07/09/2016 Ernestina PennaNicholas Schenk, MD

## 2016-07-09 NOTE — Plan of Care (Signed)
Problem: Pain Management: Goal: General experience of comfort will improve and pain level will decrease Outcome: Progressing Pt still having perineal pain, moderately relieved with various pain medications

## 2016-07-14 NOTE — Progress Notes (Signed)
This encounter was created in error - please disregard.

## 2016-07-21 ENCOUNTER — Encounter (HOSPITAL_COMMUNITY): Payer: Self-pay

## 2016-07-21 ENCOUNTER — Inpatient Hospital Stay (HOSPITAL_COMMUNITY)
Admission: AD | Admit: 2016-07-21 | Discharge: 2016-07-21 | Disposition: A | Discharge status: Home / Self Care | Payer: BLUE CROSS/BLUE SHIELD | Source: Ambulatory Visit | Attending: Obstetrics and Gynecology | Admitting: Obstetrics and Gynecology

## 2016-07-21 DIAGNOSIS — Z87891 Personal history of nicotine dependence: Secondary | ICD-10-CM | POA: Insufficient documentation

## 2016-07-21 DIAGNOSIS — R109 Unspecified abdominal pain: Secondary | ICD-10-CM

## 2016-07-21 DIAGNOSIS — S3141XA Laceration without foreign body of vagina and vulva, initial encounter: Secondary | ICD-10-CM

## 2016-07-21 NOTE — MAU Note (Signed)
Pt is 2 weeks PP- had 3rd degree episiotomy during delivery, knows stitches have probably dissolved but saw a cut this morning.

## 2016-07-21 NOTE — Discharge Instructions (Signed)
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
## 2017-06-27 ENCOUNTER — Encounter: Payer: Self-pay | Admitting: Sports Medicine

## 2017-06-27 ENCOUNTER — Ambulatory Visit (INDEPENDENT_AMBULATORY_CARE_PROVIDER_SITE_OTHER): Payer: BLUE CROSS/BLUE SHIELD | Admitting: Sports Medicine

## 2017-06-27 DIAGNOSIS — Z113 Encounter for screening for infections with a predominantly sexual mode of transmission: Secondary | ICD-10-CM | POA: Insufficient documentation

## 2017-06-27 DIAGNOSIS — Z3009 Encounter for other general counseling and advice on contraception: Secondary | ICD-10-CM | POA: Diagnosis not present

## 2017-06-27 DIAGNOSIS — F419 Anxiety disorder, unspecified: Secondary | ICD-10-CM | POA: Diagnosis not present

## 2017-06-27 DIAGNOSIS — Z0000 Encounter for general adult medical examination without abnormal findings: Secondary | ICD-10-CM | POA: Diagnosis not present

## 2017-06-27 DIAGNOSIS — F329 Major depressive disorder, single episode, unspecified: Secondary | ICD-10-CM

## 2017-06-27 DIAGNOSIS — Z309 Encounter for contraceptive management, unspecified: Secondary | ICD-10-CM | POA: Insufficient documentation

## 2017-06-27 DIAGNOSIS — F32A Depression, unspecified: Secondary | ICD-10-CM

## 2017-06-27 MED ORDER — CITALOPRAM HYDROBROMIDE 10 MG PO TABS: 10.0000 mg | ORAL_TABLET | Freq: Every day | ORAL | 3 refills | Status: DC

## 2017-06-27 NOTE — Assessment & Plan Note (Signed)
Adding routine screening labs.
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
## 2017-06-27 NOTE — Assessment & Plan Note (Signed)
Checking routine labs 

## 2017-06-27 NOTE — Assessment & Plan Note (Signed)
Referral to Dr. Lyn HollingsheadAlexander for consideration of Mirena. Pregnancy and STI screening done today.

## 2017-06-27 NOTE — Assessment & Plan Note (Signed)
Celexa 10. Return in 1 month for PHQ/GAD.

## 2017-06-28 LAB — COMPREHENSIVE METABOLIC PANEL WITH GFR
AG Ratio: 1.8 (calc) (ref 1.0–2.5)
ALT: 9 U/L (ref 6–29)
CO2: 29 mmol/L (ref 20–32)
Calcium: 9.6 mg/dL (ref 8.6–10.2)
Chloride: 104 mmol/L (ref 98–110)
Globulin: 2.5 g/dL (ref 1.9–3.7)
Sodium: 140 mmol/L (ref 135–146)
Total Bilirubin: 0.3 mg/dL (ref 0.2–1.2)

## 2017-06-28 LAB — LIPID PANEL W/REFLEX DIRECT LDL
Cholesterol: 138 mg/dL (ref <200)
HDL: 50 mg/dL — ABNORMAL LOW (ref >50)
LDL Cholesterol (Calc): 72 mg/dL (calc)
Non-HDL Cholesterol (Calc): 88 mg/dL (ref <130)
Total CHOL/HDL Ratio: 2.8 (calc) (ref <5.0)
Triglycerides: 82 mg/dL (ref <150)

## 2017-06-28 LAB — COMPREHENSIVE METABOLIC PANEL
AST: 11 U/L (ref 10–30)
Albumin: 4.4 g/dL (ref 3.6–5.1)
Alkaline phosphatase (APISO): 51 U/L (ref 33–115)
BUN: 9 mg/dL (ref 7–25)
Creat: 0.61 mg/dL (ref 0.50–1.10)
Glucose, Bld: 84 mg/dL (ref 65–99)
Potassium: 3.8 mmol/L (ref 3.5–5.3)
Total Protein: 6.9 g/dL (ref 6.1–8.1)

## 2017-06-28 LAB — HEPATITIS PANEL, ACUTE
Hep A IgM: NONREACTIVE
Hep B C IgM: NONREACTIVE
Hepatitis B Surface Ag: NONREACTIVE
Hepatitis C Ab: NONREACTIVE
SIGNAL TO CUT-OFF: 0.01 (ref <1.00)

## 2017-06-28 LAB — CBC
HCT: 37.6 % (ref 35.0–45.0)
Hemoglobin: 12.7 g/dL (ref 11.7–15.5)
MCH: 29.7 pg (ref 27.0–33.0)
MCHC: 33.8 g/dL (ref 32.0–36.0)
MCV: 88.1 fL (ref 80.0–100.0)
MPV: 10 fL (ref 7.5–12.5)
Platelets: 298 10*3/uL (ref 140–400)
RBC: 4.27 Million/uL (ref 3.80–5.10)
RDW: 13.5 % (ref 11.0–15.0)
WBC: 9 10*3/uL (ref 3.8–10.8)

## 2017-06-28 LAB — TSH: TSH: 2.94 m[IU]/L

## 2017-06-28 LAB — C. TRACHOMATIS/N. GONORRHOEAE RNA
C. trachomatis RNA, TMA: NOT DETECTED
N. gonorrhoeae RNA, TMA: NOT DETECTED

## 2017-06-28 LAB — RPR: RPR Ser Ql: NONREACTIVE

## 2017-06-28 LAB — HEMOGLOBIN A1C
Hgb A1c MFr Bld: 5.2 % of total Hgb (ref <5.7)
Mean Plasma Glucose: 103 (calc)
eAG (mmol/L): 5.7 (calc)

## 2017-06-28 LAB — PREGNANCY, URINE: Preg Test, Ur: NEGATIVE

## 2017-06-28 LAB — HIV ANTIBODY (ROUTINE TESTING W REFLEX): HIV 1&2 Ab, 4th Generation: NONREACTIVE

## 2017-07-01 ENCOUNTER — Encounter: Payer: Self-pay | Admitting: Osteopathic Medicine

## 2017-07-01 ENCOUNTER — Ambulatory Visit (INDEPENDENT_AMBULATORY_CARE_PROVIDER_SITE_OTHER): Payer: BLUE CROSS/BLUE SHIELD | Admitting: Osteopathic Medicine

## 2017-07-01 ENCOUNTER — Other Ambulatory Visit (HOSPITAL_COMMUNITY)
Admission: RE | Admit: 2017-07-01 | Discharge: 2017-07-01 | Disposition: A | Discharge status: Home / Self Care | Payer: BLUE CROSS/BLUE SHIELD | Source: Ambulatory Visit | Attending: Family Medicine | Admitting: Family Medicine

## 2017-07-01 VITALS — BP 105/65 | HR 66 | Temp 98.1°F | Wt 102.0 lb

## 2017-07-01 DIAGNOSIS — Z124 Encounter for screening for malignant neoplasm of cervix: Secondary | ICD-10-CM

## 2017-07-01 DIAGNOSIS — Z3009 Encounter for other general counseling and advice on contraception: Secondary | ICD-10-CM

## 2017-07-01 DIAGNOSIS — Z30430 Encounter for insertion of intrauterine contraceptive device: Secondary | ICD-10-CM

## 2017-07-01 LAB — POCT URINE PREGNANCY: Preg Test, Ur: NEGATIVE

## 2017-07-01 MED ORDER — LEVONORGESTREL 20 MCG/24HR IU IUD: 1.0000 | INTRAUTERINE_SYSTEM | Freq: Once | INTRAUTERINE | 0 refills | Status: AC
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
## 2017-07-01 NOTE — Patient Instructions (Signed)
IUD AFTER-CARE INSTRUCTIONS: READ THOROUGHLY  Your Mirena IUD is currently approved to remain in place for 5 years. At that time, if you wish to receive a new IUD, this can be placed when your current one is removed. If you wish to remove your IUD at any time, for any reason, this can be done easily by your doctor.   You should feel for the strings to your IUD routinely. If you cannot locate the strings, it is recommended you alert your doctor of this.   Be aware that in the first few weeks, your new IUD may cause some discomfort/cramping as it settles into place in your uterus. To ease discomfort, you may apply heating pad to abdomen and take Ibuprofen 800mg by mouth every 6 hours as needed, but avoid using this dose continuously for more than 5 days. Walking helps as well. You can also expect some irregular bleeding but it should not be heavy for more than a few days.   If pain is severe or if severe bleeding occurs, or if foul-smelling discharge or fever develops, or if you have any other concerns - contact your doctor right away or go to the Emergency Room.  IUD's are a very reliable method of birth control, but no method is 100% effective. If you think you may be pregnant, see your doctor right away. An IUD will not protect you from sexually transmitted infections such as HIV, gonorrhea, chlamydia, HPV and others.   It is recommended that you see your doctor as directed for routine well-woman care, which includes Pap testing and may include screening for infections.   

## 2017-07-03 LAB — CYTOLOGY - PAP: DIAGNOSIS: NEGATIVE

## 2017-07-29 ENCOUNTER — Ambulatory Visit (INDEPENDENT_AMBULATORY_CARE_PROVIDER_SITE_OTHER): Payer: BLUE CROSS/BLUE SHIELD | Admitting: Sports Medicine

## 2017-07-29 ENCOUNTER — Encounter: Payer: Self-pay | Admitting: Osteopathic Medicine

## 2017-07-29 ENCOUNTER — Ambulatory Visit (INDEPENDENT_AMBULATORY_CARE_PROVIDER_SITE_OTHER): Payer: BLUE CROSS/BLUE SHIELD | Admitting: Osteopathic Medicine

## 2017-07-29 VITALS — BP 117/70 | HR 88 | Temp 98.2°F | Wt 105.0 lb

## 2017-07-29 DIAGNOSIS — F419 Anxiety disorder, unspecified: Secondary | ICD-10-CM | POA: Diagnosis not present

## 2017-07-29 DIAGNOSIS — Z30431 Encounter for routine checking of intrauterine contraceptive device: Secondary | ICD-10-CM

## 2017-07-29 DIAGNOSIS — H9191 Unspecified hearing loss, right ear: Secondary | ICD-10-CM | POA: Diagnosis not present

## 2017-07-29 DIAGNOSIS — F329 Major depressive disorder, single episode, unspecified: Secondary | ICD-10-CM

## 2017-07-29 DIAGNOSIS — F32A Depression, unspecified: Secondary | ICD-10-CM

## 2017-07-29 MED ORDER — CITALOPRAM HYDROBROMIDE 20 MG PO TABS: 20.0000 mg | ORAL_TABLET | Freq: Every day | ORAL | 3 refills | Status: DC

## 2017-07-29 NOTE — Progress Notes (Signed)
CC: IUD string checkup  S: Mirena IUD inserted 07/01/17, since then some heavy bleeding but seems to be easing up now though not stopped. No significant pain, no unusual discharge or fever. Hasn't felt for strings herself  O: GYN: No lesions/ulcers to external genitalia, normal urethra, normal vaginal mucosa, physiologic discharge, cervix normal without lesions, IUD strings visible, device not seen at os  A/P: IUD monitoring. IUD strings visible, not trimmed

## 2017-07-29 NOTE — Assessment & Plan Note (Signed)
Not much response to Celexa 10, increasing to 20. Return in 1 month.

## 2017-07-29 NOTE — Assessment & Plan Note (Signed)
For back to [facility].
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
## 2017-08-26 ENCOUNTER — Ambulatory Visit: Payer: BLUE CROSS/BLUE SHIELD | Admitting: Sports Medicine

## 2017-09-06 ENCOUNTER — Ambulatory Visit: Payer: BLUE CROSS/BLUE SHIELD | Admitting: Sports Medicine

## 2017-09-13 ENCOUNTER — Ambulatory Visit: Payer: BLUE CROSS/BLUE SHIELD | Admitting: Sports Medicine

## 2017-09-23 ENCOUNTER — Other Ambulatory Visit: Payer: Self-pay | Admitting: Sports Medicine

## 2017-10-21 ENCOUNTER — Other Ambulatory Visit: Payer: Self-pay | Admitting: Sports Medicine

## 2017-10-21 DIAGNOSIS — F419 Anxiety disorder, unspecified: Principal | ICD-10-CM

## 2017-10-21 DIAGNOSIS — F329 Major depressive disorder, single episode, unspecified: Secondary | ICD-10-CM

## 2017-10-21 DIAGNOSIS — F32A Depression, unspecified: Secondary | ICD-10-CM

## 2018-02-25 IMAGING — US US MFM OB FOLLOW-UP
1 series · 14 of 28 positions shown · non-contrast
Comparison: none

[Series 1: us mfm ob follow-up · 33 acquisitions, 14 frames shown]
[im 2/33]
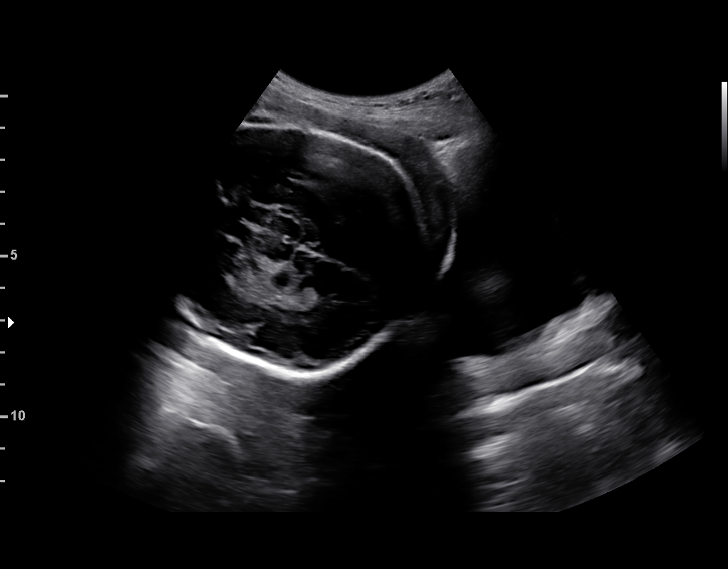
[im 4/33]
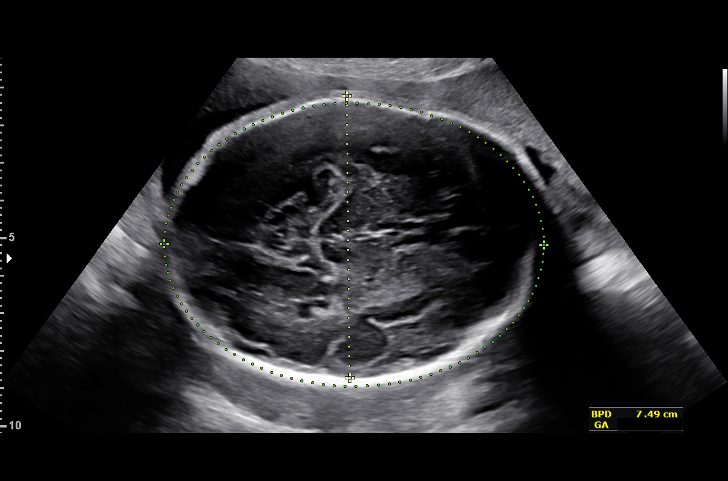
[im 6/33]
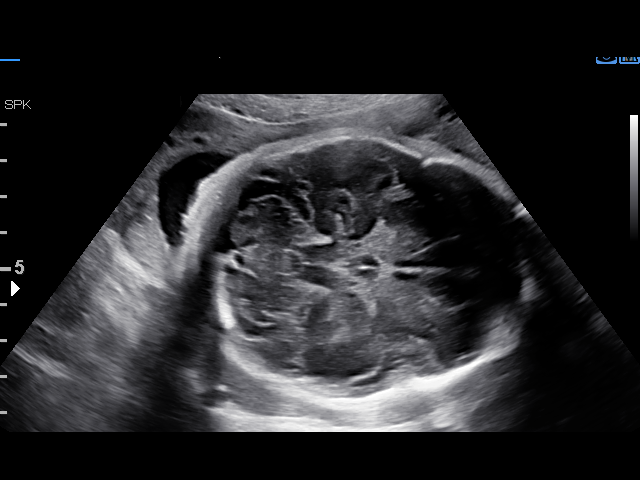
[im 9/33]
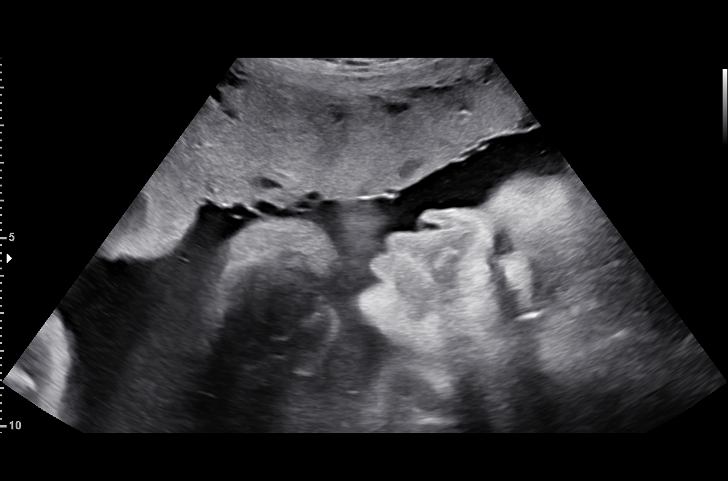
[im 11/33]
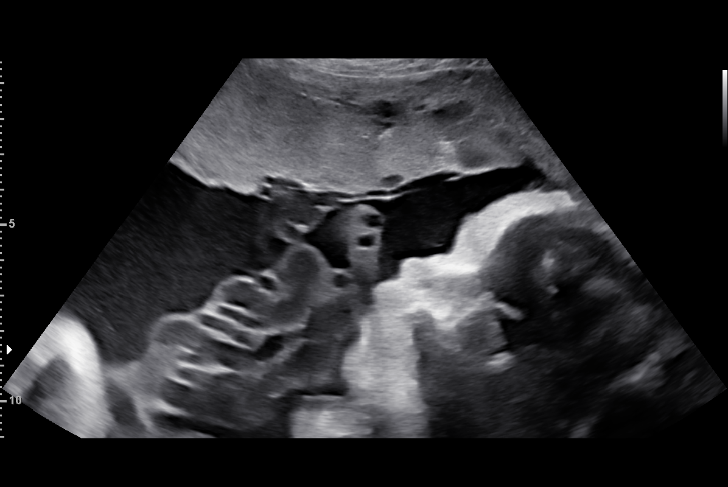
[im 14/33]
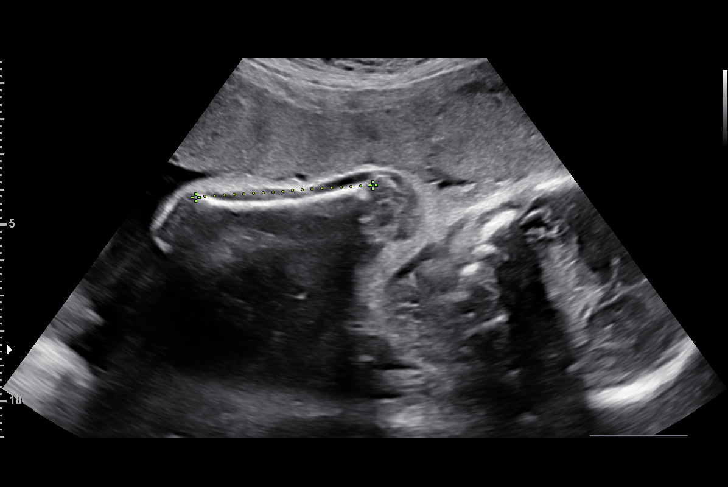
[im 16/33]
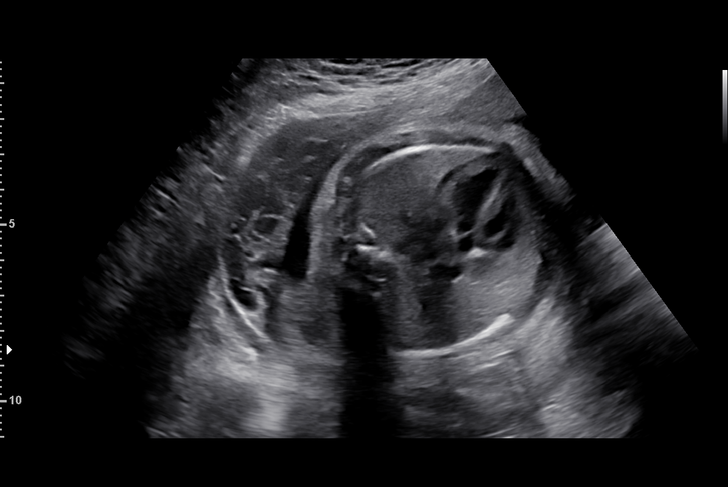
[im 18/33]
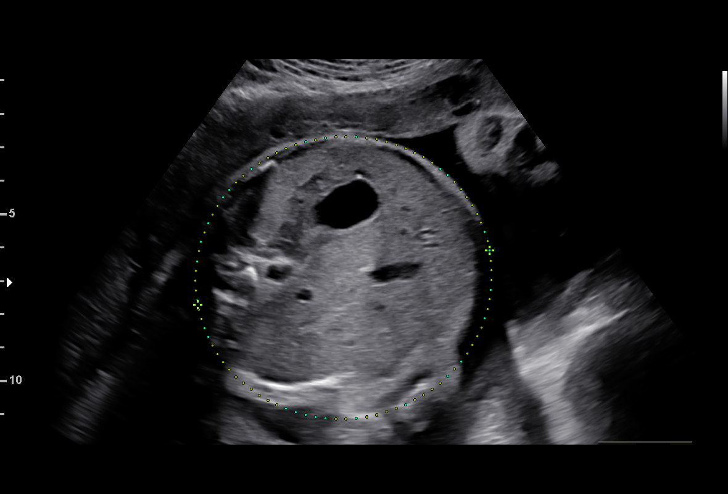
[im 21/33]
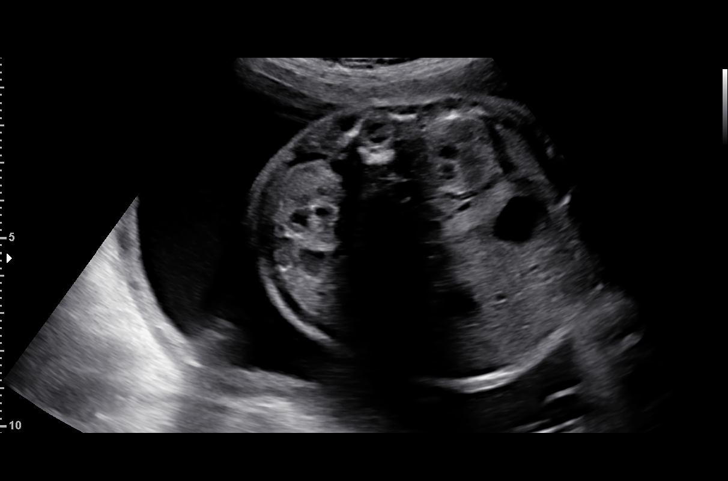
[im 23/33]
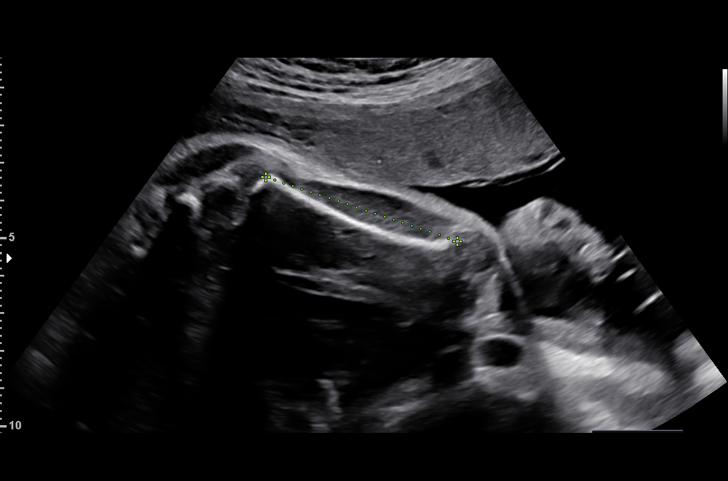
[im 25/33]
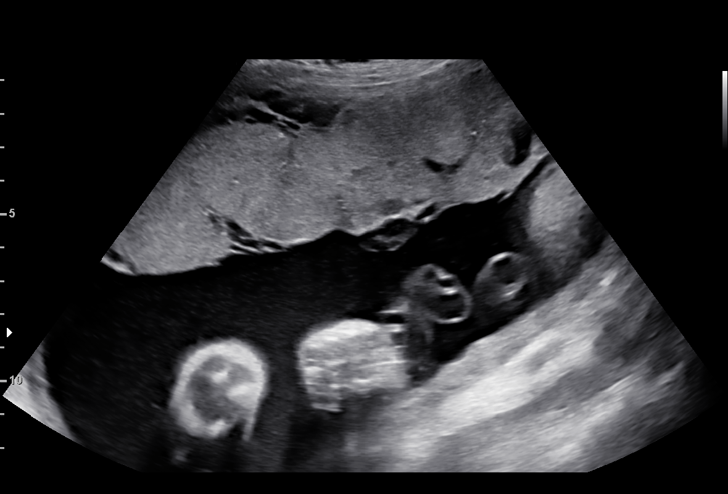
[im 28/33]
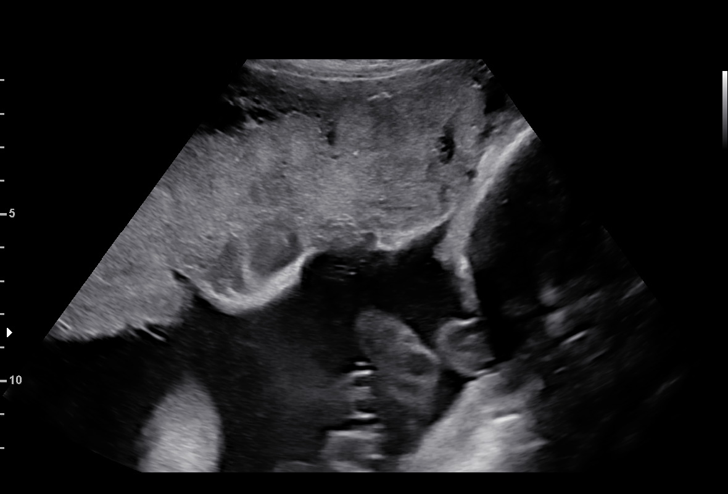
[im 30/33]
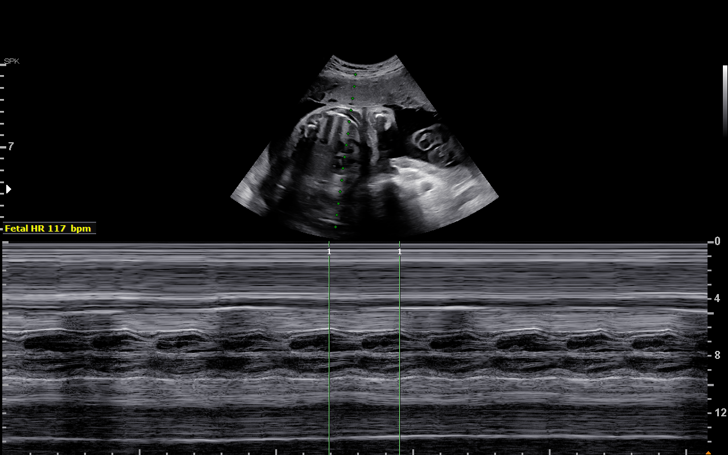
[im 33/33]
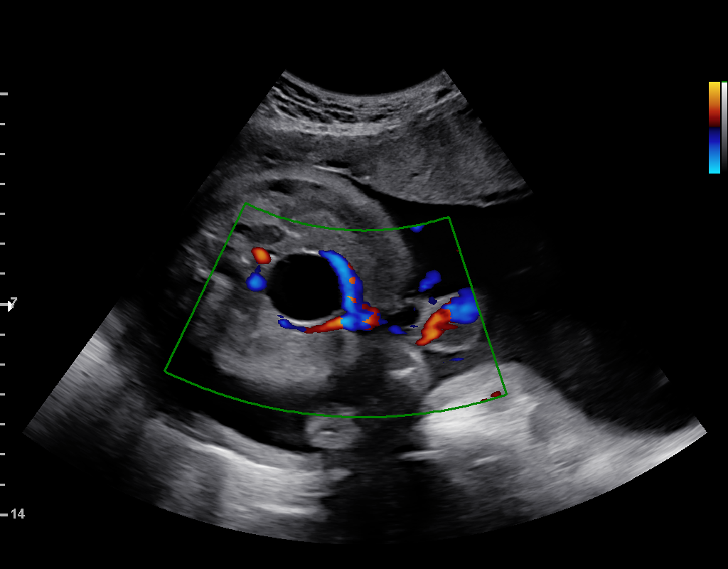

[14 of 28 positions shown; findings below may reference images not displayed]

1  SHANICE SCHUETZ            [id number]      [id number]     [id number]
Indications

30 weeks gestation of pregnancy
Late prenatal care, second trimester
Antenatal follow-up for nonvisualized fetal
anatomy
OB History

Blood Type:            Height:  4'11"  Weight (lb):  108       BMI:
Gravidity:    4         Term:   1        Prem:   0        SAB:   2
TOP:          0       Ectopic:  0        Living: 1
Fetal Evaluation

Num Of Fetuses:     1
Fetal Heart         125
Rate(bpm):
Cardiac Activity:   Observed
Presentation:       Cephalic
Placenta:           Anterior, above cervical os
P. Cord Insertion:  Visualized

Amniotic Fluid
AFI FV:      Subjectively within normal limits

AFI Sum(cm)     %Tile       Largest Pocket(cm)
17.97           68

RUQ(cm)       RLQ(cm)       LUQ(cm)        LLQ(cm)
7.42
Biometry

BPD:        75  mm     G. Age:  30w 1d         40  %    CI:        73.48   %    70 - 86
FL/HC:       19.2  %    19.2 -
HC:        278  mm     G. Age:  30w 3d         26  %    HC/AC:       1.02       0.99 -
AC:      272.1  mm     G. Age:  31w 2d         80  %    FL/BPD:      71.2  %    71 - 87
FL:       53.4  mm     G. Age:  28w 3d          5  %    FL/AC:       19.6  %    20 - 24
HUM:      49.1  mm     G. Age:  29w 0d         29  %

Est. FW:    1677   gm     3 lb 6 oz     57  %
Gestational Age

LMP:           30w 0d        Date:  09/26/15                 EDD:   07/02/16
U/S Today:     30w 1d                                        EDD:   07/01/16
Best:          30w 0d     Det. By:  LMP  (09/26/15)          EDD:   07/02/16
Anatomy

Cranium:               Previously seen        Aortic Arch:            Appears normal
Cavum:                 Appears normal         Ductal Arch:            Previously seen
Ventricles:            Appears normal         Diaphragm:              Appears normal
Choroid Plexus:        Previously seen        Stomach:                Appears normal, left
sided
Cerebellum:            Appears normal         Abdomen:                Appears normal
Posterior Fossa:       Appears normal         Abdominal Wall:         Previously seen
Nuchal Fold:           Not applicable (>20    Cord Vessels:           Appears normal (3
wks GA)                                        vessel cord)
Face:                  Appears normal         Kidneys:                Appear normal
(orbits and profile)
Lips:                  Appears normal         Bladder:                Appears normal
Thoracic:              Appears normal         Spine:                  Previously seen
Previously seen
Heart:                 Appears normal         Upper Extremities:      Previously seen
(4CH, axis, and situs
RVOT:                  Previously seen        Lower Extremities:      Previously seen
LVOT:                  Appears normal

Other:  Male gender. Heels previously  visualized.
Impression

Single IUP at 30w 0d
Normal interval anatomy
Fetal growth is appropriate (57th %tile)
Anterior placenta without previa
Normal amniotic fluid volume
Recommendations

Follow up ultrasounds as clinically indicated

## 2018-09-08 ENCOUNTER — Ambulatory Visit (INDEPENDENT_AMBULATORY_CARE_PROVIDER_SITE_OTHER): Payer: BC Managed Care – PPO

## 2018-09-08 ENCOUNTER — Encounter: Payer: Self-pay | Admitting: Sports Medicine

## 2018-09-08 ENCOUNTER — Other Ambulatory Visit: Payer: Self-pay

## 2018-09-08 ENCOUNTER — Ambulatory Visit (INDEPENDENT_AMBULATORY_CARE_PROVIDER_SITE_OTHER): Payer: BC Managed Care – PPO | Admitting: Sports Medicine

## 2018-09-08 DIAGNOSIS — F419 Anxiety disorder, unspecified: Secondary | ICD-10-CM | POA: Diagnosis not present

## 2018-09-08 DIAGNOSIS — F329 Major depressive disorder, single episode, unspecified: Secondary | ICD-10-CM | POA: Diagnosis not present

## 2018-09-08 DIAGNOSIS — M25862 Other specified joint disorders, left knee: Secondary | ICD-10-CM

## 2018-09-08 DIAGNOSIS — Z0000 Encounter for general adult medical examination without abnormal findings: Secondary | ICD-10-CM

## 2018-09-08 DIAGNOSIS — F32A Depression, unspecified: Secondary | ICD-10-CM

## 2018-09-08 MED ORDER — CITALOPRAM HYDROBROMIDE 10 MG PO TABS: 10.0000 mg | ORAL_TABLET | Freq: Every day | ORAL | 3 refills | Status: DC

## 2018-09-08 MED ORDER — MELOXICAM 15 MG PO TABS: ORAL_TABLET | ORAL | 3 refills | Status: DC

## 2018-09-08 MED ORDER — ONDANSETRON 8 MG PO TBDP: 8.0000 mg | ORAL_TABLET | Freq: Three times a day (TID) | ORAL | 3 refills | Status: AC | PRN

## 2018-09-08 NOTE — Assessment & Plan Note (Signed)
Checking routine labs 

## 2018-09-08 NOTE — Assessment & Plan Note (Signed)
Fairly marked valgus shape of her knees, left worse than right. Adding knee x-rays, patellofemoral rehabilitation exercises, meloxicam.
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
## 2018-09-09 LAB — COMPLETE METABOLIC PANEL WITH GFR
AG Ratio: 1.7 (calc) (ref 1.0–2.5)
ALT: 9 U/L (ref 6–29)
AST: 13 U/L (ref 10–30)
Albumin: 4.4 g/dL (ref 3.6–5.1)
Alkaline phosphatase (APISO): 41 U/L (ref 31–125)
BUN: 15 mg/dL (ref 7–25)
CO2: 26 mmol/L (ref 20–32)
Calcium: 9.3 mg/dL (ref 8.6–10.2)
Chloride: 106 mmol/L (ref 98–110)
Creat: 0.67 mg/dL (ref 0.50–1.10)
GFR, Est African American: 142 mL/min/{1.73_m2} (ref >=60)
GFR, Est Non African American: 122 mL/min/{1.73_m2} (ref >=60)
Globulin: 2.6 g/dL (calc) (ref 1.9–3.7)
Glucose, Bld: 89 mg/dL (ref 65–99)
Potassium: 4 mmol/L (ref 3.5–5.3)
Sodium: 139 mmol/L (ref 135–146)
Total Bilirubin: 0.5 mg/dL (ref 0.2–1.2)
Total Protein: 7 g/dL (ref 6.1–8.1)

## 2018-09-09 LAB — HEMOGLOBIN A1C
Hgb A1c MFr Bld: 5 % of total Hgb (ref <5.7)
Mean Plasma Glucose: 97 (calc)
eAG (mmol/L): 5.4 (calc)

## 2018-09-09 LAB — TSH: TSH: 0.68 mIU/L

## 2018-09-09 LAB — CBC
HCT: 40.7 % (ref 35.0–45.0)
Hemoglobin: 13.5 g/dL (ref 11.7–15.5)
MCH: 31.7 pg (ref 27.0–33.0)
MCHC: 33.2 g/dL (ref 32.0–36.0)
MCV: 95.5 fL (ref 80.0–100.0)
MPV: 10.2 fL (ref 7.5–12.5)
Platelets: 297 10*3/uL (ref 140–400)
RBC: 4.26 10*6/uL (ref 3.80–5.10)
RDW: 11.7 % (ref 11.0–15.0)
WBC: 7.5 10*3/uL (ref 3.8–10.8)

## 2018-09-09 LAB — VITAMIN D 25 HYDROXY (VIT D DEFICIENCY, FRACTURES): Vit D, 25-Hydroxy: 26 ng/mL — ABNORMAL LOW (ref 30–100)

## 2018-09-09 LAB — LIPID PANEL W/REFLEX DIRECT LDL
Cholesterol: 146 mg/dL (ref <200)
HDL: 54 mg/dL (ref >=50)
LDL Cholesterol (Calc): 80 mg/dL (calc)
Non-HDL Cholesterol (Calc): 92 mg/dL (calc) (ref <130)
Total CHOL/HDL Ratio: 2.7 (calc) (ref <5.0)
Triglycerides: 50 mg/dL (ref <150)

## 2018-09-09 MED ORDER — CALCIUM CARBONATE-VITAMIN D 600-400 MG-UNIT PO TABS: 1.0000 | ORAL_TABLET | Freq: Two times a day (BID) | ORAL | 11 refills | Status: AC

## 2018-09-09 MED ORDER — VITAMIN D (ERGOCALCIFEROL) 1.25 MG (50000 UNIT) PO CAPS: 50000.0000 [IU] | ORAL_CAPSULE | ORAL | 0 refills | Status: AC

## 2018-09-09 NOTE — Addendum Note (Signed)
Addended by: Silverio Decamp on: 09/09/2018 09:51 AM   Modules accepted: Orders

## 2018-09-16 ENCOUNTER — Ambulatory Visit: Payer: BLUE CROSS/BLUE SHIELD | Admitting: Osteopathic Medicine

## 2018-10-03 ENCOUNTER — Other Ambulatory Visit: Payer: Self-pay | Admitting: Sports Medicine

## 2018-10-03 DIAGNOSIS — F419 Anxiety disorder, unspecified: Secondary | ICD-10-CM

## 2018-10-03 DIAGNOSIS — F329 Major depressive disorder, single episode, unspecified: Secondary | ICD-10-CM

## 2018-10-22 ENCOUNTER — Other Ambulatory Visit: Payer: Self-pay

## 2018-10-22 ENCOUNTER — Ambulatory Visit: Payer: BC Managed Care – PPO | Admitting: Sports Medicine

## 2018-11-02 ENCOUNTER — Other Ambulatory Visit: Payer: Self-pay | Admitting: Sports Medicine

## 2019-01-06 ENCOUNTER — Other Ambulatory Visit: Payer: Self-pay | Admitting: Sports Medicine

## 2019-01-06 DIAGNOSIS — M25862 Other specified joint disorders, left knee: Secondary | ICD-10-CM

## 2020-07-12 IMAGING — DX RIGHT KNEE - 1-2 VIEW
1 series · 1 of 1 positions shown · non-contrast
Comparison: None.

CLINICAL DATA: Knee pain, no known injury, patellar dysfunction

EXAM:
RIGHT KNEE - 1-2 VIEW; LEFT KNEE - COMPLETE 4+ VIEW

[knee ap]
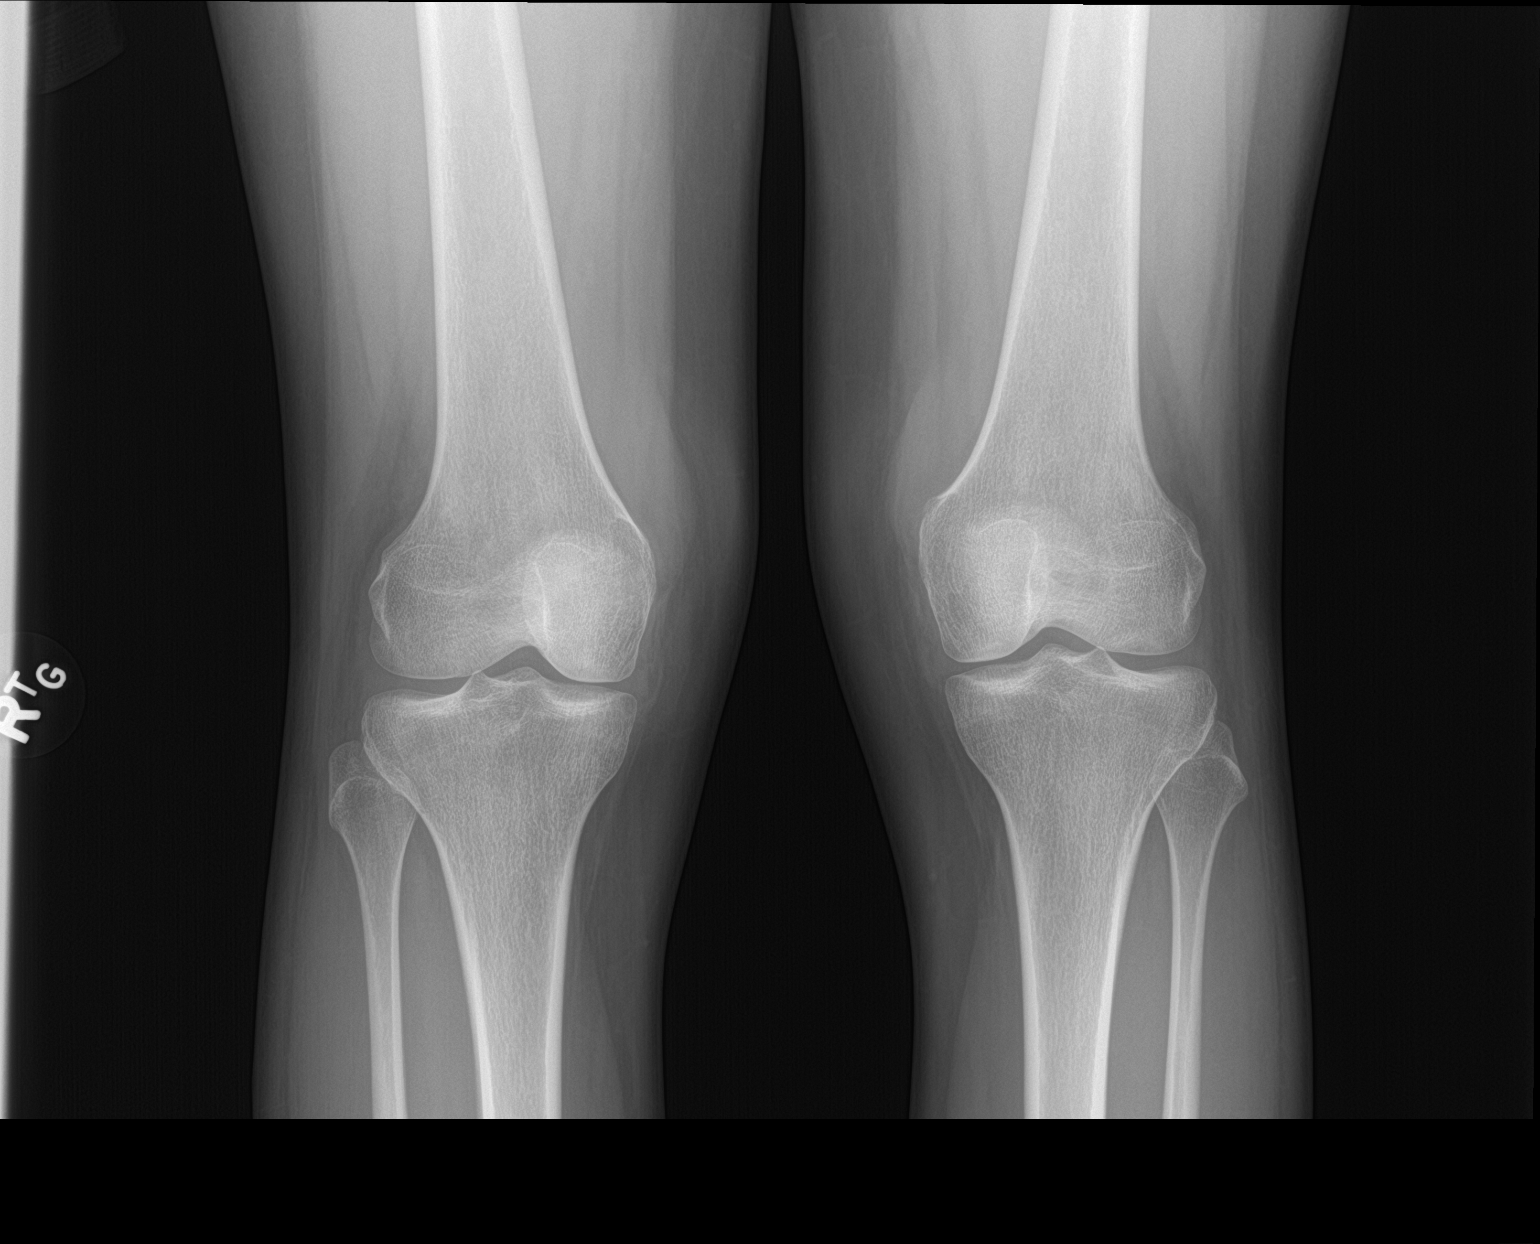

[1 of 1 positions shown; findings below may reference images not displayed]

FINDINGS: No fracture or dislocation of the left knee. Joint spaces are well
preserved. Normal position of the patella. The right knee is
unremarkable and symmetric in frontal standing and tunnel views.
Soft tissues are unremarkable.
IMPRESSION: No fracture or dislocation of the left knee. Joint spaces are well
preserved. Normal position of the patella. The right knee is
unremarkable and symmetric in frontal standing and tunnel views.
Soft tissues are unremarkable.
